# Patient Record
Sex: Male | Born: 1950 | Race: White | Hispanic: No | Smoking: Never smoker
Health system: Southern US, Community
[De-identification: ages and names within clinical notes are randomized; demographics above are authoritative.]

## PROBLEM LIST (undated history)

## (undated) DIAGNOSIS — E7800 Pure hypercholesterolemia, unspecified: Secondary | ICD-10-CM

## (undated) DIAGNOSIS — K900 Celiac disease: Secondary | ICD-10-CM

## (undated) DIAGNOSIS — Z9221 Personal history of antineoplastic chemotherapy: Secondary | ICD-10-CM

## (undated) DIAGNOSIS — N049 Nephrotic syndrome with unspecified morphologic changes: Secondary | ICD-10-CM

## (undated) DIAGNOSIS — N189 Chronic kidney disease, unspecified: Secondary | ICD-10-CM

## (undated) HISTORY — DX: Chronic kidney disease, unspecified: N18.9

## (undated) HISTORY — PX: EYE SURGERY: SHX253

## (undated) HISTORY — DX: Celiac disease: K90.0

---

## 1999-09-11 ENCOUNTER — Ambulatory Visit: Admission: RE | Admit: 1999-09-11 | Discharge: 1999-09-11 | Payer: Self-pay | Admitting: Family Medicine

## 1999-11-01 ENCOUNTER — Ambulatory Visit (HOSPITAL_COMMUNITY): Admission: RE | Admit: 1999-11-01 | Discharge: 1999-11-01 | Payer: Self-pay | Admitting: *Deleted

## 2000-01-01 ENCOUNTER — Encounter: Payer: Self-pay | Admitting: Nephrology

## 2000-01-01 ENCOUNTER — Ambulatory Visit (HOSPITAL_COMMUNITY): Admission: RE | Admit: 2000-01-01 | Discharge: 2000-01-02 | Payer: Self-pay | Admitting: Nephrology

## 2000-01-28 ENCOUNTER — Ambulatory Visit (HOSPITAL_COMMUNITY): Admission: RE | Admit: 2000-01-28 | Discharge: 2000-01-28 | Payer: Self-pay | Admitting: Emergency Medicine

## 2000-01-28 ENCOUNTER — Encounter: Payer: Self-pay | Admitting: Emergency Medicine

## 2000-02-01 ENCOUNTER — Encounter: Payer: Self-pay | Admitting: Emergency Medicine

## 2000-02-01 ENCOUNTER — Ambulatory Visit (HOSPITAL_COMMUNITY): Admission: RE | Admit: 2000-02-01 | Discharge: 2000-02-01 | Payer: Self-pay | Admitting: Emergency Medicine

## 2000-02-12 ENCOUNTER — Ambulatory Visit (HOSPITAL_COMMUNITY): Admission: RE | Admit: 2000-02-12 | Discharge: 2000-02-12 | Payer: Self-pay | Admitting: Gastroenterology

## 2000-10-29 ENCOUNTER — Encounter: Admission: RE | Admit: 2000-10-29 | Discharge: 2001-01-27 | Payer: Self-pay | Admitting: *Deleted

## 2000-11-21 ENCOUNTER — Ambulatory Visit (HOSPITAL_BASED_OUTPATIENT_CLINIC_OR_DEPARTMENT_OTHER): Admission: RE | Admit: 2000-11-21 | Discharge: 2000-11-21 | Payer: Self-pay | Admitting: Ophthalmology

## 2005-10-07 ENCOUNTER — Ambulatory Visit (HOSPITAL_COMMUNITY): Admission: RE | Admit: 2005-10-07 | Discharge: 2005-10-07 | Payer: Self-pay | Admitting: Nephrology

## 2005-10-23 ENCOUNTER — Ambulatory Visit (HOSPITAL_COMMUNITY): Admission: RE | Admit: 2005-10-23 | Discharge: 2005-10-23 | Payer: Self-pay | Admitting: Nephrology

## 2005-10-25 ENCOUNTER — Ambulatory Visit (HOSPITAL_COMMUNITY): Admission: RE | Admit: 2005-10-25 | Discharge: 2005-10-26 | Payer: Self-pay | Admitting: Nephrology

## 2007-01-13 ENCOUNTER — Encounter: Admission: RE | Admit: 2007-01-13 | Discharge: 2007-04-13 | Payer: Self-pay | Admitting: Gastroenterology

## 2009-09-18 ENCOUNTER — Encounter (INDEPENDENT_AMBULATORY_CARE_PROVIDER_SITE_OTHER): Payer: Self-pay | Admitting: *Deleted

## 2010-04-26 ENCOUNTER — Telehealth (INDEPENDENT_AMBULATORY_CARE_PROVIDER_SITE_OTHER): Payer: Self-pay | Admitting: *Deleted

## 2010-12-11 NOTE — Progress Notes (Signed)
Summary: CHANGED GI CARE  Phone Note Outgoing Call Call back at [facility] Phone [phone]   Call placed by: Harlow Mares CMA [facility]),  April 26, 2010 1:56 PM Call placed to: Patient Summary of Call: patient states that he has changed practices and already had his colonoscopy. we will note in IDX Initial call taken by: Harlow Mares CMA [facility]),  April 26, 2010 1:56 PM

## 2011-04-17 ENCOUNTER — Emergency Department (HOSPITAL_COMMUNITY): Payer: No Typology Code available for payment source

## 2011-04-17 ENCOUNTER — Emergency Department (HOSPITAL_COMMUNITY)
Admission: EM | Admit: 2011-04-17 | Discharge: 2011-04-17 | Disposition: A | Discharge status: Home / Self Care | Payer: No Typology Code available for payment source | Attending: Surgery | Admitting: Surgery

## 2011-04-17 DIAGNOSIS — S0003XA Contusion of scalp, initial encounter: Secondary | ICD-10-CM | POA: Insufficient documentation

## 2011-04-17 DIAGNOSIS — M542 Cervicalgia: Secondary | ICD-10-CM | POA: Insufficient documentation

## 2011-04-17 DIAGNOSIS — IMO0002 Reserved for concepts with insufficient information to code with codable children: Secondary | ICD-10-CM | POA: Insufficient documentation

## 2011-04-17 DIAGNOSIS — Y92410 Unspecified street and highway as the place of occurrence of the external cause: Secondary | ICD-10-CM | POA: Insufficient documentation

## 2011-04-17 DIAGNOSIS — Y998 Other external cause status: Secondary | ICD-10-CM | POA: Insufficient documentation

## 2011-04-17 DIAGNOSIS — K900 Celiac disease: Secondary | ICD-10-CM | POA: Insufficient documentation

## 2012-03-06 ENCOUNTER — Other Ambulatory Visit: Payer: Self-pay | Admitting: Dermatology

## 2012-04-09 ENCOUNTER — Encounter: Payer: Self-pay | Admitting: Gastroenterology

## 2013-04-04 IMAGING — CR DG CHEST 1V PORT
1 series · 1 of 1 positions shown · non-contrast
Comparison: None.

CLINICAL DATA: Fell - right chest wall pain

PORTABLE CHEST - 1 VIEW

[AP]
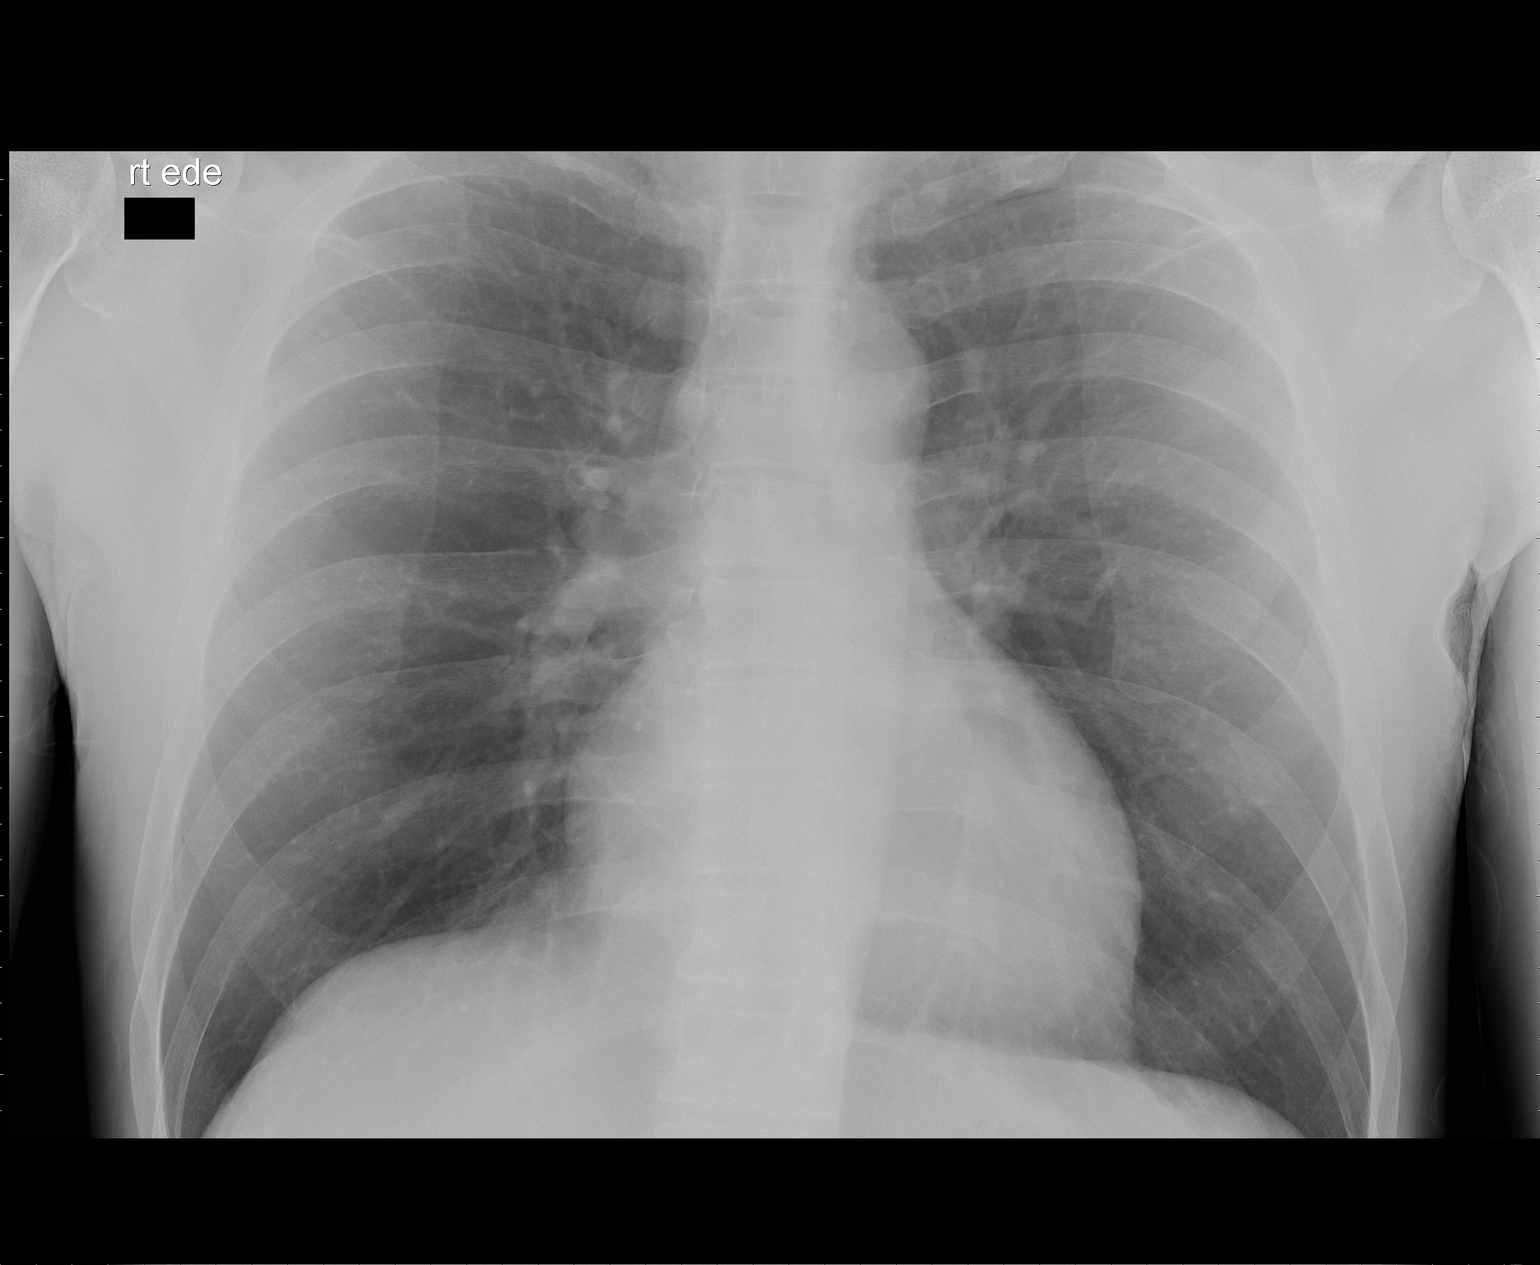

[1 of 1 positions shown; findings below may reference images not displayed]

FINDINGS: Heart and mediastinal contours normal.  Lungs clear.
Osseous structures intact in one-view.
IMPRESSION: No active disease in one-view.
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
## 2014-10-10 ENCOUNTER — Other Ambulatory Visit: Payer: Self-pay | Admitting: Dermatology

## 2014-10-10 ENCOUNTER — Other Ambulatory Visit: Payer: Self-pay

## 2014-10-10 DIAGNOSIS — N6459 Other signs and symptoms in breast: Secondary | ICD-10-CM

## 2014-10-20 ENCOUNTER — Ambulatory Visit
Admission: RE | Admit: 2014-10-20 | Discharge: 2014-10-20 | Disposition: A | Discharge status: Home / Self Care | Payer: 59 | Source: Ambulatory Visit

## 2014-10-20 DIAGNOSIS — N6459 Other signs and symptoms in breast: Secondary | ICD-10-CM

## 2015-01-29 ENCOUNTER — Ambulatory Visit (INDEPENDENT_AMBULATORY_CARE_PROVIDER_SITE_OTHER): Payer: 59 | Admitting: Emergency Medicine

## 2015-01-29 VITALS — BP 100/60 | HR 77 | Temp 97.4°F | Resp 16 | Ht 72.5 in | Wt 157.6 lb

## 2015-01-29 DIAGNOSIS — S338XXA Sprain of other parts of lumbar spine and pelvis, initial encounter: Secondary | ICD-10-CM

## 2015-01-29 DIAGNOSIS — S39012A Strain of muscle, fascia and tendon of lower back, initial encounter: Secondary | ICD-10-CM

## 2015-01-29 MED ORDER — CYCLOBENZAPRINE HCL 10 MG PO TABS: 10.0000 mg | ORAL_TABLET | Freq: Three times a day (TID) | ORAL | Status: DC | PRN

## 2015-01-29 MED ORDER — NAPROXEN SODIUM 550 MG PO TABS: 550.0000 mg | ORAL_TABLET | Freq: Two times a day (BID) | ORAL | Status: AC

## 2015-01-29 MED ORDER — HYDROCODONE-ACETAMINOPHEN 5-325 MG PO TABS: 1.0000 | ORAL_TABLET | ORAL | Status: DC | PRN

## 2015-01-29 NOTE — Patient Instructions (Signed)

## 2015-01-29 NOTE — Progress Notes (Signed)
[facility] [address], [place] [address] [phone]  Date:  01/29/2015   Name:  Bobby Galloatrick J Alsip   DOB:  07-21-1951   MRN:  [id number]  PCP:  No primary care provider on file.    Chief Complaint: Back Pain   History of Present Illness:  Bobby Osborne is a 64 y.o. very pleasant male patient who presents with the following:  Injured his back yesterday moving a mattress.  Wife is in hospice Non radiating.  No neuro symptoms.  Unable to stand erect No direct injury. Pain increases with standing and bending Less when sitting No improvement with over the counter medications or other home remedies. Denies other complaint or health concern today.   There are no active problems to display for this patient.   Past Medical History  Diagnosis Date  . Chronic kidney disease   . Celiac disease     Past Surgical History  Procedure Laterality Date  . Eye surgery      History  Substance Use Topics  . Smoking status: Never Smoker   . Smokeless tobacco: Not on file  . Alcohol Use: Not on file    Family History  Problem Relation Age of Onset  . Mental illness Mother     Allergies  Allergen Reactions  . Lipitor [Atorvastatin]     Affects liver enzymes     Medication list has been reviewed and updated.  No current outpatient prescriptions on file prior to visit.   No current facility-administered medications on file prior to visit.    Review of Systems:  As per HPI, otherwise negative.    Physical Examination: Filed Vitals:   01/29/15 0854  BP: 100/60  Pulse: 77  Temp: 97.4 F (36.3 C)  Resp: 16   Filed Vitals:   01/29/15 0854  Height: 6' 0.5" (1.842 m)  Weight: 157 lb 9.6 oz (71.487 kg)   Body mass index is 21.07 kg/(m^2). Ideal Body Weight: Weight in (lb) to have BMI = 25: 186.5  GEN: WDWN, tender lumbar paraspinous, Non-toxic, A & O x 3 HEENT: Atraumatic, Normocephalic. Neck supple. No masses, No LAD. Ears and  Nose: No external deformity. CV: RRR, No M/G/R. No JVD. No thrill. No extra heart sounds. PULM: CTA B, no wheezes, crackles, rhonchi. No retractions. No resp. distress. No accessory muscle use. ABD: S, NT, ND, +BS. No rebound. No HSM. EXTR: No c/c/e NEURO Normal gait.  PSYCH: Normally interactive. Conversant. Not depressed or anxious appearing.  Calm demeanor.  BACK:  Tender lumbar on left.  Motor intact   Assessment and Plan: Sciatic neuritis Anaprox  Flexeril vicodin  Signed,  Phillips OdorJeffery Balraj Brayfield, MD

## 2017-02-19 ENCOUNTER — Ambulatory Visit: Payer: Medicare Other | Attending: Family Medicine | Admitting: Physical Therapy

## 2017-02-19 ENCOUNTER — Encounter: Payer: Self-pay | Admitting: Physical Therapy

## 2017-02-19 DIAGNOSIS — M545 Low back pain, unspecified: Secondary | ICD-10-CM

## 2017-02-19 DIAGNOSIS — M62830 Muscle spasm of back: Secondary | ICD-10-CM
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
## 2017-02-25 ENCOUNTER — Ambulatory Visit: Payer: Medicare Other | Admitting: Physical Therapy

## 2017-02-25 ENCOUNTER — Encounter: Payer: Self-pay | Admitting: Physical Therapy

## 2017-02-25 DIAGNOSIS — M545 Low back pain, unspecified: Secondary | ICD-10-CM

## 2017-02-25 DIAGNOSIS — M62830 Muscle spasm of back: Secondary | ICD-10-CM

## 2017-02-25 NOTE — Therapy (Signed)
[facility] [address] [place], [place], [address] Phone: [phone]   Fax:  [phone]  Physical Therapy Treatment  Patient Details  Name: RILEN SHUKLA MRN: [id number] Date of Birth: [birth year] Referring Provider: Zoe Lan  Encounter Date: 02/25/2017      PT End of Session - 02/25/17 1529    Visit Number 2   Date for PT Re-Evaluation 04/21/17   PT Start Time 1445   PT Stop Time 1540   PT Time Calculation (min) 55 min   Activity Tolerance Patient tolerated treatment well   Behavior During Therapy [facility] for tasks assessed/performed      Past Medical History:  Diagnosis Date  . Celiac disease   . Chronic kidney disease     Past Surgical History:  Procedure Laterality Date  . EYE SURGERY      There were no vitals filed for this visit.      Subjective Assessment - 02/25/17 1449    Subjective Pt. reports no pain and says that he is doing well.   Currently in Pain? No/denies   Pain Score 0-No pain                         [facility] Adult PT Treatment/Exercise - 02/25/17 0001      Exercises   Exercises Lumbar     Lumbar Exercises: Stretches   Passive Hamstring Stretch 3 reps;20 seconds   Passive Hamstring Stretch Limitations both   Piriformis Stretch 2 reps;20 seconds  both     Lumbar Exercises: Aerobic   Stationary Bike Nustep 6 min      Lumbar Exercises: Machines for Strengthening   Cybex Knee Extension 2x10 10lbs    Cybex Knee Flexion 2x10 25lbs   Leg Press 3x10 30lbs   30lbs     Lumbar Exercises: Standing   Row Theraband;15 reps   Theraband Level (Row) Level 2 (Red)   Row Limitations 2 sets   Shoulder Extension Theraband;15 reps   Theraband Level (Shoulder Extension) Level 2 (Red)   Shoulder Extension Limitations 2 sets    Other Standing Lumbar Exercises overhead press with back extension with greenball  10 reps      Modalities   Modalities Electrical Stimulation      Electrical Stimulation   Electrical Stimulation Location low back    Electrical Stimulation Action IFC   Electrical Stimulation Parameters supine   Electrical Stimulation Goals Pain                            Plan - 02/25/17 1530    Clinical Impression Statement Pt. completed all exercises well. Reported no increase in pain. Tolerated HS stretch and piriformis stretch. He stated that exercises were comfortable. Could possibly tolerate more resistance during exercises. Pt. very talkative today.   Rehab Potential Good   PT Frequency 2x / week   PT Duration 8 weeks   PT Treatment/Interventions ADLs/Self Care Home Management;Cryotherapy;Electrical Stimulation;Moist Heat;Traction;Ultrasound;Patient/family education;Balance training;Therapeutic exercise;Therapeutic activities;Manual techniques   PT Next Visit Plan Will slowly get him moving in the gym, assess flexion vs extension   Consulted and Agree with Plan of Care Patient      Patient will benefit from skilled therapeutic intervention in order to improve the following deficits and impairments:  Abnormal gait, Decreased activity tolerance, Decreased mobility, Decreased strength, Decreased range of motion, Difficulty walking, Increased fascial restricitons, Increased muscle  spasms, Impaired flexibility, Improper body mechanics, Pain  Visit Diagnosis: Muscle spasm of back  Acute midline low back pain without sciatica     Problem List There are no active problems to display for this patient.   Dorothyann Gibbs 02/25/2017, 3:39 PM  [facility] [address] [place], [place], [address] Phone: [phone]   Fax:  [phone]  Name: NATHANUEL CABREJA MRN: [id number] Date of Birth: [birth year]

## 2017-02-27 ENCOUNTER — Encounter: Payer: Self-pay | Admitting: Physical Therapy

## 2017-02-27 ENCOUNTER — Ambulatory Visit: Payer: Medicare Other | Admitting: Physical Therapy

## 2017-02-27 DIAGNOSIS — M545 Low back pain, unspecified: Secondary | ICD-10-CM

## 2017-02-27 DIAGNOSIS — M62830 Muscle spasm of back: Secondary | ICD-10-CM | POA: Diagnosis not present

## 2017-02-27 NOTE — Therapy (Signed)
[facility] [address] [place], [place], [address] Phone: [phone]   Fax:  [phone]  Physical Therapy Treatment  Patient Details  Name: Bobby Osborne MRN: [id number] Date of Birth: 1951/10/10 Referring Provider: Zoe Lan  Encounter Date: 02/27/2017      PT End of Session - 02/27/17 1525    Visit Number 3   Date for PT Re-Evaluation 04/21/17   PT Start Time 1444   PT Stop Time 1542   PT Time Calculation (min) 58 min   Activity Tolerance Patient tolerated treatment well   Behavior During Therapy [facility] for tasks assessed/performed      Past Medical History:  Diagnosis Date  . Celiac disease   . Chronic kidney disease     Past Surgical History:  Procedure Laterality Date  . EYE SURGERY      There were no vitals filed for this visit.      Subjective Assessment - 02/27/17 1502    Subjective I was a little sore, but no increase of pain right now   Currently in Pain? No/denies                         [facility] Adult PT Treatment/Exercise - 02/27/17 0001      Lumbar Exercises: Aerobic   Elliptical 5 minutes R=6 I-10   UBE (Upper Arm Bike) resisted gait all directions     Lumbar Exercises: Machines for Strengthening   Cybex Knee Extension 2x10 15lbs    Cybex Knee Flexion 2x10 25lbs   Other Lumbar Machine Exercise 10# scapular stabilization 2 ways on pulleys, 20# AR press   Other Lumbar Machine Exercise 5# hip extension and abduction, 25# seated rows and lats , weighted ball lifts back to wall     Electrical Stimulation   Electrical Stimulation Location low back    Electrical Stimulation Action IFC   Electrical Stimulation Parameters supine   Electrical Stimulation Goals Pain                  PT Short Term Goals - 02/27/17 1528      PT SHORT TERM GOAL #1   Title independent with initial HEP   Time 1   Period Weeks   Status Achieved           PT Long Term  Goals - 02/27/17 1528      PT LONG TERM GOAL #1   Title decrease pain 50%   Time 8   Period Weeks   Status New     PT LONG TERM GOAL #2   Title increase Lumbar ROM to WNL's   Time 8   Period Weeks   Status New     PT LONG TERM GOAL #3   Title understand proper posture and body mechanics   Time 8   Period Weeks   Status New     PT LONG TERM GOAL #4   Title return safely to gym setting   Time 8   Period Weeks   Status New               Plan - 02/27/17 1526    Clinical Impression Statement Patient with some tightness and pain with calf stretches and hip exercises, all other exercises caused no issues.     PT Next Visit Plan core and stretches   Consulted and Agree with Plan of Care Patient      Patient  will benefit from skilled therapeutic intervention in order to improve the following deficits and impairments:  Abnormal gait, Decreased activity tolerance, Decreased mobility, Decreased strength, Decreased range of motion, Difficulty walking, Increased fascial restricitons, Increased muscle spasms, Impaired flexibility, Improper body mechanics, Pain  Visit Diagnosis: Muscle spasm of back  Acute midline low back pain without sciatica     Problem List There are no active problems to display for this patient.   Jearld Lesch., PT 02/27/2017, 3:29 PM  [facility] [place] [facility] [address] [place], [place], [address] Phone: [phone]   Fax:  [phone]  Name: SOURISH ALLENDER MRN: [id number] Date of Birth: 09-Nov-1951

## 2017-03-03 ENCOUNTER — Ambulatory Visit: Payer: Medicare Other | Admitting: Physical Therapy

## 2017-03-07 ENCOUNTER — Ambulatory Visit: Payer: Medicare Other | Admitting: Physical Therapy

## 2017-03-11 ENCOUNTER — Ambulatory Visit: Payer: Medicare Other | Attending: Family Medicine | Admitting: Physical Therapy

## 2017-03-11 ENCOUNTER — Encounter: Payer: Self-pay | Admitting: Physical Therapy

## 2017-03-11 DIAGNOSIS — M545 Low back pain, unspecified: Secondary | ICD-10-CM

## 2017-03-11 DIAGNOSIS — M62830 Muscle spasm of back: Secondary | ICD-10-CM

## 2017-03-11 NOTE — Therapy (Signed)
[facility] [address] [place], [place], [address] Phone: [phone]   Fax:  [phone]  Physical Therapy Treatment  Patient Details  Name: Bobby Osborne MRN: [id number] Date of Birth: 10/04/1951 Referring Provider: Zoe Lan  Encounter Date: 03/11/2017      PT End of Session - 03/11/17 0916    Visit Number 4   Date for PT Re-Evaluation 04/21/17   PT Start Time 0839   PT Stop Time 0930   PT Time Calculation (min) 51 min   Activity Tolerance Patient tolerated treatment well   Behavior During Therapy [facility] for tasks assessed/performed      Past Medical History:  Diagnosis Date  . Celiac disease   . Chronic kidney disease     Past Surgical History:  Procedure Laterality Date  . EYE SURGERY      There were no vitals filed for this visit.      Subjective Assessment - 03/11/17 0841    Subjective Patient reports no pain in his back and is feeling good.    Currently in Pain? No/denies                         [facility] Adult PT Treatment/Exercise - 03/11/17 0001      Lumbar Exercises: Aerobic   Elliptical 6 minutes R=6 I-10   UBE (Upper Arm Bike) Constant work 40 watts x 4 min     Lumbar Exercises: Machines for Strengthening   Cybex Knee Extension 2x10 15lbs    Cybex Knee Flexion 2x10 25lbs   Other Lumbar Machine Exercise standing chops left,straight,right 2 x 12, seated black tband ext 2 x 10   Other Lumbar Machine Exercise lat pull & rows 25# 2 x 10      Modalities   Modalities Electrical Stimulation;Moist Heat     Moist Heat Therapy   Number Minutes Moist Heat 15 Minutes   Moist Heat Location Lumbar Spine     Electrical Stimulation   Electrical Stimulation Location low back    Electrical Stimulation Action IFC   Electrical Stimulation Parameters supine   Electrical Stimulation Goals Pain                  PT Short Term Goals - 02/27/17 1528      PT SHORT TERM  GOAL #1   Title independent with initial HEP   Time 1   Period Weeks   Status Achieved           PT Long Term Goals - 02/27/17 1528      PT LONG TERM GOAL #1   Title decrease pain 50%   Time 8   Period Weeks   Status New     PT LONG TERM GOAL #2   Title increase Lumbar ROM to WNL's   Time 8   Period Weeks   Status New     PT LONG TERM GOAL #3   Title understand proper posture and body mechanics   Time 8   Period Weeks   Status New     PT LONG TERM GOAL #4   Title return safely to gym setting   Time 8   Period Weeks   Status New               Plan - 03/11/17 0912    Clinical Impression Statement The patient stated that the constant work of the UBE was difficult. He required  VCs to get full ROM on leg extension. The patient also required cues to control and slow down the exercises.    PT Next Visit Plan Cont with strenghtening and flexibility      Patient will benefit from skilled therapeutic intervention in order to improve the following deficits and impairments:  Abnormal gait, Decreased activity tolerance, Decreased mobility, Decreased strength, Decreased range of motion, Difficulty walking, Increased fascial restricitons, Increased muscle spasms, Impaired flexibility, Improper body mechanics, Pain  Visit Diagnosis: Muscle spasm of back  Acute midline low back pain without sciatica     Problem List There are no active problems to display for this patient.   Raquel James SPTA 03/11/2017, 9:31 AM  [facility] [place] [facility] [address] [place], [place], [address] Phone: [phone]   Fax:  [phone]  Name: KRUZE ATCHLEY MRN: [id number] Date of Birth: 12-23-1950

## 2017-03-13 ENCOUNTER — Encounter: Payer: Self-pay | Admitting: Physical Therapy

## 2017-03-13 ENCOUNTER — Ambulatory Visit: Payer: Medicare Other | Admitting: Physical Therapy

## 2017-03-13 DIAGNOSIS — M545 Low back pain, unspecified: Secondary | ICD-10-CM

## 2017-03-13 DIAGNOSIS — M62830 Muscle spasm of back: Secondary | ICD-10-CM | POA: Diagnosis not present

## 2017-03-13 NOTE — Therapy (Signed)
[place], [place], [address] Phone: [phone]   Fax:  [phone]  Physical Therapy Treatment  Patient Details  Name: Bobby Osborne MRN: [id number] Date of Birth: 08/09/1951 Referring Provider: Eldridge Abrahams  Encounter Date: 03/13/2017      PT End of Session - 03/13/17 0846    Visit Number 5   Date for PT Re-Evaluation 04/21/17   PT Start Time 0802   PT Stop Time 1219   PT Time Calculation (min) 55 min   Activity Tolerance Patient tolerated treatment well   Behavior During Therapy [facility] for tasks assessed/performed      Past Medical History:  Diagnosis Date  . Celiac disease   . Chronic kidney disease     Past Surgical History:  Procedure Laterality Date  . EYE SURGERY      There were no vitals filed for this visit.      Subjective Assessment - 03/13/17 0802    Subjective Ptr reports no pain. His back was sore yesterday but he thinks its from the chair he sits in at home. No other issuses since last visit.    Currently in Pain? No/denies   Pain Score 0-No pain                         [facility] Adult PT Treatment/Exercise - 03/13/17 0001      Lumbar Exercises: Stretches   Passive Hamstring Stretch 3 reps;20 seconds   Lower Trunk Rotation 2 reps;3 reps;20 seconds     Lumbar Exercises: Aerobic   Stationary Bike lvl2 6 min      Lumbar Exercises: Machines for Strengthening   Leg Press 3x10 30lbs    Other Lumbar Machine Exercise lat pull & rows 25# 2 x 10      Lumbar Exercises: Standing   Other Standing Lumbar Exercises standing trunk rotation/back extention  2x10   yellow ball     Lumbar Exercises: Supine   Other Supine Lumbar Exercises single leg bridges  2x10      Modalities   Modalities Electrical Stimulation;Moist Heat     Moist Heat Therapy   Number Minutes Moist Heat 15 Minutes   Moist Heat Location Lumbar Spine     Electrical Stimulation   Electrical Stimulation Location low back    Electrical Stimulation Action IFC   Electrical Stimulation Parameters supine   Electrical Stimulation Goals Pain                  PT Short Term Goals - 02/27/17 1528      PT SHORT TERM GOAL #1   Title independent with initial HEP   Time 1   Period Weeks   Status Achieved           PT Long Term Goals - 03/13/17 0849      PT LONG TERM GOAL #1   Title decrease pain 50%   Status Achieved     PT LONG TERM GOAL #2   Title increase Lumbar ROM to WNL's   Time 8   Period Weeks   Status On-going     PT LONG TERM GOAL #3   Title understand proper posture and body mechanics   Period Weeks   Status Partially Met     PT LONG TERM GOAL #4   Title return safely to gym setting   Time 8   Period Weeks   Status New  Plan - 03/13/17 0846    Clinical Impression Statement Pt tolerated treatment well. Pt had some weakness and difficulty keeping RLE straight during single leg bridges. No reports of increased pain only reported muscle fatigue.    Rehab Potential Good   PT Frequency 2x / week   PT Duration 8 weeks   PT Treatment/Interventions ADLs/Self Care Home Management;Cryotherapy;Electrical Stimulation;Moist Heat;Traction;Ultrasound;Patient/family education;Balance training;Therapeutic exercise;Therapeutic activities;Manual techniques   PT Next Visit Plan Cont with strenghtening and flexibility      Patient will benefit from skilled therapeutic intervention in order to improve the following deficits and impairments:  Abnormal gait, Decreased activity tolerance, Decreased mobility, Decreased strength, Decreased range of motion, Difficulty walking, Increased fascial restricitons, Increased muscle spasms, Impaired flexibility, Improper body mechanics, Pain  Visit Diagnosis: Muscle spasm of back  Acute midline low back pain without sciatica     Problem List There are no active problems to display for  this patient.   Octavia Bruckner 03/13/2017, 8:50 AM  [place], [place], [address] Phone: [phone]   Fax:  [phone]  Name: Bobby Osborne MRN: [id number] Date of Birth: 11/15/1950

## 2017-03-17 ENCOUNTER — Encounter: Payer: Self-pay | Admitting: Physical Therapy

## 2017-03-17 ENCOUNTER — Ambulatory Visit: Payer: Medicare Other | Admitting: Physical Therapy

## 2017-03-17 DIAGNOSIS — M62830 Muscle spasm of back: Secondary | ICD-10-CM

## 2017-03-17 DIAGNOSIS — M545 Low back pain, unspecified: Secondary | ICD-10-CM

## 2017-03-17 NOTE — Therapy (Signed)
[place], [place], [address] Phone: [phone]   Fax:  [phone]  Physical Therapy Treatment  Patient Details  Name: Bobby Osborne MRN: [id number] Date of Birth: 07-Jan-1951 Referring Provider: Eldridge Abrahams  Encounter Date: 03/17/2017      PT End of Session - 03/17/17 0928    Visit Number 6   Date for PT Re-Evaluation 04/21/17   PT Start Time 0845   PT Stop Time 0942   PT Time Calculation (min) 57 min   Activity Tolerance Patient tolerated treatment well   Behavior During Therapy [facility] for tasks assessed/performed      Past Medical History:  Diagnosis Date  . Celiac disease   . Chronic kidney disease     Past Surgical History:  Procedure Laterality Date  . EYE SURGERY      There were no vitals filed for this visit.      Subjective Assessment - 03/17/17 0846    Subjective "My back hurts, I woke up this morning my back was killing me, I took a hot shower this morning"   Currently in Pain? Yes   Pain Score 3    Pain Location Back   Pain Orientation Lower;Left                         [facility] Adult PT Treatment/Exercise - 03/17/17 0001      Lumbar Exercises: Aerobic   Elliptical 4 minutes R=5 I-10   UBE (Upper Arm Bike) L 6 33fd/3rev     Lumbar Exercises: Machines for Strengthening   Leg Press 3x10 40lbs    Other Lumbar Machine Exercise lat pull & rows 25# 2x15     Lumbar Exercises: Standing   Other Standing Lumbar Exercises Straight arm pull downs 25lb 2x15   Other Standing Lumbar Exercises standing trunk rotation/back extention  2x10      Lumbar Exercises: Seated   Long Arc Quad on Chair --  yellow ball     Modalities   Modalities Electrical Stimulation;Moist Heat     Moist Heat Therapy   Number Minutes Moist Heat 15 Minutes   Moist Heat Location Lumbar Spine     Electrical Stimulation   Electrical Stimulation Location low back    Electrical  Stimulation Action IFC   Electrical Stimulation Parameters supine   Electrical Stimulation Goals Pain                  PT Short Term Goals - 02/27/17 1528      PT SHORT TERM GOAL #1   Title independent with initial HEP   Time 1   Period Weeks   Status Achieved           PT Long Term Goals - 03/13/17 0849      PT LONG TERM GOAL #1   Title decrease pain 50%   Status Achieved     PT LONG TERM GOAL #2   Title increase Lumbar ROM to WNL's   Time 8   Period Weeks   Status On-going     PT LONG TERM GOAL #3   Title understand proper posture and body mechanics   Period Weeks   Status Partially Met     PT LONG TERM GOAL #4   Title return safely to gym setting   Time 8   Period Weeks   Status New  Plan - 03/17/17 0928    Clinical Impression Statement Pt able to do all of today's exercises, does have some grimace of pain with trunk rotations. Reports that he could feel his back with straight arm pull downs and on elliptical. Reports that he left his windows up in his house making it too cold causing increase back pain.   Rehab Potential Good   PT Frequency 2x / week   PT Duration 8 weeks   PT Treatment/Interventions ADLs/Self Care Home Management;Cryotherapy;Electrical Stimulation;Moist Heat;Traction;Ultrasound;Patient/family education;Balance training;Therapeutic exercise;Therapeutic activities;Manual techniques   PT Next Visit Plan Cont with strenghtening and flexibility      Patient will benefit from skilled therapeutic intervention in order to improve the following deficits and impairments:  Abnormal gait, Decreased activity tolerance, Decreased mobility, Decreased strength, Decreased range of motion, Difficulty walking, Increased fascial restricitons, Increased muscle spasms, Impaired flexibility, Improper body mechanics, Pain  Visit Diagnosis: Muscle spasm of back  Acute midline low back pain without sciatica     Problem  List There are no active problems to display for this patient.   Scot Jun 03/17/2017, 9:31 AM  [facility] [address] [place], [place], [address] Phone: [phone]   Fax:  [phone]  Name: Bobby Osborne MRN: [id number] Date of Birth: 1951/01/25

## 2017-03-20 ENCOUNTER — Encounter: Payer: Self-pay | Admitting: Physical Therapy

## 2017-03-20 ENCOUNTER — Ambulatory Visit: Payer: Medicare Other | Admitting: Physical Therapy

## 2017-03-20 DIAGNOSIS — M62830 Muscle spasm of back: Secondary | ICD-10-CM

## 2017-03-20 DIAGNOSIS — M545 Low back pain, unspecified: Secondary | ICD-10-CM

## 2017-03-20 NOTE — Therapy (Signed)
[place], [place], [address] Phone: [phone]   Fax:  [phone]  Physical Therapy Treatment  Patient Details  Name: Bobby Osborne MRN: [id number] Date of Birth: 1951/07/15 Referring Provider: Eldridge Abrahams  Encounter Date: 03/20/2017      PT End of Session - 03/20/17 0928    Visit Number 7   Date for PT Re-Evaluation 04/21/17   PT Start Time 0845   PT Stop Time 0944   PT Time Calculation (min) 59 min   Activity Tolerance Patient tolerated treatment well   Behavior During Therapy [facility] for tasks assessed/performed      Past Medical History:  Diagnosis Date  . Celiac disease   . Chronic kidney disease     Past Surgical History:  Procedure Laterality Date  . EYE SURGERY      There were no vitals filed for this visit.      Subjective Assessment - 03/20/17 0848    Subjective "Feeling good, windows are closed"   Currently in Pain? No/denies   Pain Score 0-No pain                         [facility] Adult PT Treatment/Exercise - 03/20/17 0001      Lumbar Exercises: Aerobic   Elliptical 4 minutes R=5 I-10   UBE (Upper Arm Bike) L 6 30fd/3rev     Lumbar Exercises: Machines for Strengthening   Cybex Knee Extension 3x10 15lbs    Cybex Knee Flexion 3x10 35lbs   Leg Press 3x10 40lbs    Other Lumbar Machine Exercise lat pull & rows 35# 3x10     Lumbar Exercises: Standing   Other Standing Lumbar Exercises Straight arm pull downs 25lb 2x15   Other Standing Lumbar Exercises Pallof press 25lb x10     Modalities   Modalities Electrical Stimulation;Moist Heat     Moist Heat Therapy   Number Minutes Moist Heat 15 Minutes   Moist Heat Location Lumbar Spine     Electrical Stimulation   Electrical Stimulation Location low back    Electrical Stimulation Action IFC   Electrical Stimulation Parameters supine   Electrical Stimulation Goals Pain                   PT Short Term Goals - 02/27/17 1528      PT SHORT TERM GOAL #1   Title independent with initial HEP   Time 1   Period Weeks   Status Achieved           PT Long Term Goals - 03/20/17 0930      PT LONG TERM GOAL #2   Title increase Lumbar ROM to WNL's   Status On-going     PT LONG TERM GOAL #3   Title understand proper posture and body mechanics   Status Partially Met     PT LONG TERM GOAL #4   Title return safely to gym setting   Status On-going               Plan - 03/20/17 03976   Clinical Impression Statement Pt performed all of his exercises well. Pt does reports some LBP with pall of press. Pt able to handel intervention with additional weight and reps.    Rehab Potential Good   PT Treatment/Interventions ADLs/Self Care Home Management;Cryotherapy;Electrical Stimulation;Moist Heat;Traction;Ultrasound;Patient/family education;Balance training;Therapeutic exercise;Therapeutic activities;Manual techniques   PT Next Visit Plan Cont with  strengthening and flexibility      Patient will benefit from skilled therapeutic intervention in order to improve the following deficits and impairments:  Abnormal gait, Decreased activity tolerance, Decreased mobility, Decreased strength, Decreased range of motion, Difficulty walking, Increased fascial restricitons, Increased muscle spasms, Impaired flexibility, Improper body mechanics, Pain  Visit Diagnosis: Muscle spasm of back  Acute midline low back pain without sciatica     Problem List There are no active problems to display for this patient.   Scot Jun, PTA 03/20/2017, 9:30 AM  [place], [place], [address] Phone: [phone]   Fax:  [phone]  Name: Bobby Osborne MRN: [id number] Date of Birth: [birth year]

## 2017-03-25 ENCOUNTER — Ambulatory Visit: Payer: Medicare Other | Admitting: Physical Therapy

## 2017-03-25 ENCOUNTER — Encounter: Payer: Self-pay | Admitting: Physical Therapy

## 2017-03-25 DIAGNOSIS — M545 Low back pain, unspecified: Secondary | ICD-10-CM

## 2017-03-25 DIAGNOSIS — M62830 Muscle spasm of back: Secondary | ICD-10-CM | POA: Diagnosis not present

## 2017-03-25 NOTE — Therapy (Signed)
[place], [place], [address] Phone: [phone]   Fax:  [phone]  Physical Therapy Treatment  Patient Details  Name: Bobby Osborne MRN: [id number] Date of Birth: 1950-12-21 Referring Provider: Eldridge Abrahams  Encounter Date: 03/25/2017      PT End of Session - 03/25/17 1010    Visit Number 8   Date for PT Re-Evaluation 04/21/17   PT Start Time 0929   PT Stop Time 1023   PT Time Calculation (min) 54 min   Activity Tolerance Patient tolerated treatment well   Behavior During Therapy [facility] for tasks assessed/performed      Past Medical History:  Diagnosis Date  . Celiac disease   . Chronic kidney disease     Past Surgical History:  Procedure Laterality Date  . EYE SURGERY      There were no vitals filed for this visit.      Subjective Assessment - 03/25/17 0929    Subjective "Good, good, doing great "   Currently in Pain? No/denies   Pain Score 0-No pain            [facility] PT Assessment - 03/25/17 0001      AROM   Overall AROM Comments AROM of the lumbar spine  decreased 25% with extension, some pain with R side bending                     [facility] Adult PT Treatment/Exercise - 03/25/17 0001      Lumbar Exercises: Stretches   Passive Hamstring Stretch 4 reps;10 seconds   Single Knee to Chest Stretch 1 rep;10 seconds   Lower Trunk Rotation 1 rep;20 seconds     Lumbar Exercises: Aerobic   Elliptical 6 minutes R=10 I-10   UBE (Upper Arm Bike) L 6 82fd/2rev Standing      Lumbar Exercises: Machines for Strengthening   Cybex Knee Extension 3x10 15lbs    Cybex Knee Flexion 3x10 35lbs   Other Lumbar Machine Exercise lat pull & rows 35# 3x10                  PT Short Term Goals - 02/27/17 1528      PT SHORT TERM GOAL #1   Title independent with initial HEP   Time 1   Period Weeks   Status Achieved           PT Long Term Goals - 03/25/17  03494     PT LONG TERM GOAL #1   Title decrease pain 50%   Status Achieved     PT LONG TERM GOAL #2   Title increase Lumbar ROM to WNL's   Status Partially Met     PT LONG TERM GOAL #3   Title understand proper posture and body mechanics     PT LONG TERM GOAL #4   Title return safely to gym setting   Status Partially Met               Plan - 03/25/17 1010    Clinical Impression Statement Pt has progresses towards all goals, completing some. Does reports some pain in his low back L side with trunk rotations and R side bending. Good strength with machine exercises   Rehab Potential Good   PT Frequency 2x / week   PT Duration 8 weeks   PT Treatment/Interventions ADLs/Self Care Home Management;Cryotherapy;Electrical Stimulation;Moist Heat;Traction;Ultrasound;Patient/family education;Balance training;Therapeutic exercise;Therapeutic activities;Manual techniques  PT Next Visit Plan D/C PT next visit      Patient will benefit from skilled therapeutic intervention in order to improve the following deficits and impairments:  Abnormal gait, Decreased activity tolerance, Decreased mobility, Decreased strength, Decreased range of motion, Difficulty walking, Increased fascial restricitons, Increased muscle spasms, Impaired flexibility, Improper body mechanics, Pain  Visit Diagnosis: Muscle spasm of back  Acute midline low back pain without sciatica     Problem List There are no active problems to display for this patient.   Scot Jun, PTA 03/25/2017, 10:12 AM  [place] [address] [place], [place], [address] Phone: [phone]   Fax:  [phone]  Name: NIKLAS CHRETIEN MRN: [id number] Date of Birth: May 22, 1951

## 2017-03-31 ENCOUNTER — Encounter: Payer: Self-pay | Admitting: Physical Therapy

## 2017-03-31 ENCOUNTER — Ambulatory Visit: Payer: Medicare Other | Admitting: Physical Therapy

## 2017-03-31 DIAGNOSIS — M62830 Muscle spasm of back: Secondary | ICD-10-CM | POA: Diagnosis not present

## 2017-03-31 DIAGNOSIS — M545 Low back pain, unspecified: Secondary | ICD-10-CM

## 2017-03-31 NOTE — Therapy (Signed)
[place], [place], [address] Phone: [phone]   Fax:  [phone]  Physical Therapy Treatment  Patient Details  Name: Bobby Osborne MRN: [id number] Date of Birth: August 11, 1951 Referring Provider: Eldridge Abrahams  Encounter Date: 03/31/2017      PT End of Session - 03/31/17 1057    Visit Number 9   Date for PT Re-Evaluation 04/21/17   PT Start Time 1014   PT Stop Time 1113   PT Time Calculation (min) 59 min   Activity Tolerance Patient tolerated treatment well   Behavior During Therapy [facility] for tasks assessed/performed      Past Medical History:  Diagnosis Date  . Celiac disease   . Chronic kidney disease     Past Surgical History:  Procedure Laterality Date  . EYE SURGERY      There were no vitals filed for this visit.      Subjective Assessment - 03/31/17 1013    Subjective "Doing good"   Currently in Pain? No/denies   Pain Score 0-No pain                         [facility] Adult PT Treatment/Exercise - 03/31/17 0001      Lumbar Exercises: Aerobic   Elliptical 6 minutes R=10 I-10   UBE (Upper Arm Bike) constant work 30 wats 2 frd/ 2 rev     Lumbar Exercises: Machines for Strengthening   Cybex Knee Extension 3x10 15lbs    Cybex Knee Flexion 3x10 35lbs   Leg Press 3x10 40lbs    Other Lumbar Machine Exercise lat pull & rows 35# 3x10     Lumbar Exercises: Standing   Other Standing Lumbar Exercises Straight arm pull downs 25lb 2x15   Other Standing Lumbar Exercises Pallof press 25lb x10     Modalities   Modalities Electrical Stimulation;Moist Heat     Moist Heat Therapy   Number Minutes Moist Heat 15 Minutes   Moist Heat Location Lumbar Spine     Electrical Stimulation   Electrical Stimulation Location low back    Electrical Stimulation Action IFC   Electrical Stimulation Parameters supine   Electrical Stimulation Goals Pain                   PT Short Term Goals - 02/27/17 1528      PT SHORT TERM GOAL #1   Title independent with initial HEP   Time 1   Period Weeks   Status Achieved           PT Long Term Goals - 03/25/17 2703      PT LONG TERM GOAL #1   Title decrease pain 50%   Status Achieved     PT LONG TERM GOAL #2   Title increase Lumbar ROM to WNL's   Status Partially Met     PT LONG TERM GOAL #3   Title understand proper posture and body mechanics     PT LONG TERM GOAL #4   Title return safely to gym setting   Status Partially Met               Plan - 03/31/17 1057    Clinical Impression Statement No issues with today's activities, Pt is pleased with his current functional status.   Rehab Potential Good   PT Frequency 2x / week   PT Duration 8 weeks   PT Treatment/Interventions ADLs/Self Care  Home Management;Cryotherapy;Electrical Stimulation;Moist Heat;Traction;Ultrasound;Patient/family education;Balance training;Therapeutic exercise;Therapeutic activities;Manual techniques   PT Next Visit Plan D/C      Patient will benefit from skilled therapeutic intervention in order to improve the following deficits and impairments:  Abnormal gait, Decreased activity tolerance, Decreased mobility, Decreased strength, Decreased range of motion, Difficulty walking, Increased fascial restricitons, Increased muscle spasms, Impaired flexibility, Improper body mechanics, Pain  Visit Diagnosis: Muscle spasm of back  Acute midline low back pain without sciatica     Problem List There are no active problems to display for this patient.   PHYSICAL THERAPY DISCHARGE SUMMARY  Visits from Start of Care: 9 Plan: Patient agrees to discharge.  Patient goals were partially met. Patient is being discharged due to being pleased with the current functional level.  ?????      Scot Jun , PTA 03/31/2017, 10:58 AM  [place], [place], [address] Phone: [phone]   Fax:  [phone]  Name: Bobby Osborne MRN: [id number] Date of Birth: 06-14-1951

## 2017-11-11 HISTORY — PX: COLONOSCOPY: SHX174

## 2018-10-08 ENCOUNTER — Encounter (HOSPITAL_COMMUNITY): Payer: Self-pay

## 2018-10-08 ENCOUNTER — Emergency Department (HOSPITAL_COMMUNITY)
Admission: EM | Admit: 2018-10-08 | Discharge: 2018-10-08 | Disposition: A | Discharge status: Home / Self Care | Payer: Medicare Other | Attending: Emergency Medicine | Admitting: Emergency Medicine

## 2018-10-08 ENCOUNTER — Other Ambulatory Visit: Payer: Self-pay

## 2018-10-08 DIAGNOSIS — R290 Tetany: Secondary | ICD-10-CM | POA: Diagnosis not present

## 2018-10-08 DIAGNOSIS — R200 Anesthesia of skin: Secondary | ICD-10-CM | POA: Diagnosis present

## 2018-10-08 LAB — CBC WITH DIFFERENTIAL/PLATELET
Abs Immature Granulocytes: 0.04 10*3/uL (ref 0.00–0.07)
Basophils Absolute: 0 10*3/uL (ref 0.0–0.1)
Basophils Relative: 0 %
Eosinophils Absolute: 0.2 10*3/uL (ref 0.0–0.5)
Eosinophils Relative: 2 %
HCT: 46.2 % (ref 39.0–52.0)
Hemoglobin: 15.4 g/dL (ref 13.0–17.0)
Immature Granulocytes: 0 %
Lymphocytes Relative: 13 %
Lymphs Abs: 1.3 10*3/uL (ref 0.7–4.0)
MCH: 30.4 pg (ref 26.0–34.0)
MCHC: 33.3 g/dL (ref 30.0–36.0)
MCV: 91.1 fL (ref 80.0–100.0)
Monocytes Absolute: 0.9 10*3/uL (ref 0.1–1.0)
Monocytes Relative: 8 %
Neutro Abs: 7.9 10*3/uL — ABNORMAL HIGH (ref 1.7–7.7)
Neutrophils Relative %: 77 %
Platelets: 250 10*3/uL (ref 150–400)
RBC: 5.07 MIL/uL (ref 4.22–5.81)
RDW: 12.5 % (ref 11.5–15.5)
WBC: 10.4 10*3/uL (ref 4.0–10.5)
nRBC: 0 % (ref 0.0–0.2)

## 2018-10-08 LAB — BASIC METABOLIC PANEL
Anion gap: 9 (ref 5–15)
BUN: 18 mg/dL (ref 8–23)
CO2: 24 mmol/L (ref 22–32)
Calcium: 9.1 mg/dL (ref 8.9–10.3)
Chloride: 105 mmol/L (ref 98–111)
Creatinine, Ser: 1.06 mg/dL (ref 0.61–1.24)
GFR calc Af Amer: >60 mL/min (ref >60)
GFR calc non Af Amer: >60 mL/min (ref >60)
Glucose, Bld: 157 mg/dL — ABNORMAL HIGH (ref 70–99)
Potassium: 3.9 mmol/L (ref 3.5–5.1)
Sodium: 138 mmol/L (ref 135–145)

## 2018-10-08 LAB — MAGNESIUM: Magnesium: 1.7 mg/dL (ref 1.7–2.4)

## 2018-10-08 NOTE — Discharge Instructions (Signed)
Please read and follow all provided instructions.  Your diagnoses today include:  1. Car[place]ed[place] spasm     Tests performed today include:  Blood counts and electrolytes -slightly high blood sugar but otherwise okay  Vital signs. See below for your results today.   Medications prescribed:   None  Take any prescribed medications only as directed.  Home care instructions:  Follow any educational materials contained in this packet.  BE VERY CAREFUL not to take multiple medicines containing Tylenol (also called acetaminophen). Doing so can lead to an overdose which can damage your liver and cause liver failure and possibly death.   Follow-up instructions: Please follow-up with your primary care provider in the next 3 days for further evaluation of your symptoms.   Return instructions:   Please return to the Emergency Department if you experience worsening symptoms.   Return if you have weakness in your arms or legs, slurred speech, trouble walking or talking, confusion, or trouble with your balance.   Please return if you have any other emergent concerns.  Additional Information:  Your vital signs today were: BP 110/79    Pulse 91    Temp 98.7 F (37.1 C) (Oral)    Resp 13    SpO2 94%  If your blood pressure (BP) was elevated above 135/85 this visit, please have this repeated by your doctor within one month. --------------

## 2018-10-08 NOTE — ED Notes (Signed)
Stroke assessment done in triage with no drift, no facial droop, bilateral hand numbness noted, no weakness.  No Code Stroke called at this time.

## 2018-10-08 NOTE — ED Notes (Signed)
ED Provider at bedside. 

## 2018-10-08 NOTE — ED Provider Notes (Signed)
[facility] EMERGENCY DEPARTMENT Provider Note   CSN: [id number] Arrival date & time: 10/08/18  1904     History   Chief Complaint Chief Complaint  Patient presents with  . Numbness    HPI Bobby Osborne is a 67 y.o. male.  Patient with history of high cholesterol, celiac disease presents the emergency department with acute onset of hand spasms.  Patient was at home with family after Thanksgiving dinner and began having right hand spasms involving the right ring and right little finger.  This was intermittent.  Patient states that it felt like his fingers "locked up".  Shortly later patient began having similar symptoms in his left thumb, again locking up, and having associated left forearm pain.  This had resolved.  Family was concerned that he may be having a stroke, prompting emergency department visit.  Patient states he has been having a normal amount of diarrhea related to his celiac disease.  No nausea or vomiting.  No fevers.  He is eating and drinking well.  Patient denies signs of stroke including: facial droop, slurred speech, aphasia, imbalance/trouble walking.  States that he plays video games for an extensive period of time every week.  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  He has not had any change with his coordination.      Past Medical History:  Diagnosis Date  . Celiac disease   . Chronic kidney disease     There are no active problems to display for this patient.   Past Surgical History:  Procedure Laterality Date  . EYE SURGERY          Home Medications    Prior to Admission medications   Medication Sig Start Date End Date Taking? Authorizing Provider  cyclobenzaprine (FLEXERIL) 10 MG tablet Take 1 tablet (10 mg total) by mouth 3 (three) times daily as needed for muscle spasms. 01/29/15   Carmelina DaneAnderson, Jeffery S, MD  HYDROcodone-acetaminophen (NORCO) 5-325 MG per tablet Take 1-2 tablets by mouth every  4 (four) hours as needed. 01/29/15   Carmelina DaneAnderson, Jeffery S, MD  oxybutynin (DITROPAN) 5 MG tablet Take 5 mg by mouth 3 (three) times daily.    [provider]    Family History Family History  Problem Relation Age of Onset  . Mental illness Mother     Social History Social History   Tobacco Use  . Smoking status: Never Smoker  Substance Use Topics  . Alcohol use: Not on file  . Drug use: Not on file     Allergies   Lipitor [atorvastatin]   Review of Systems Review of Systems  Constitutional: Negative for fever.  HENT: Negative for rhinorrhea and sore throat.   Eyes: Negative for redness.  Respiratory: Negative for cough.   Cardiovascular: Negative for chest pain.  Gastrointestinal: Positive for diarrhea. Negative for abdominal pain, nausea and vomiting.  Genitourinary: Negative for dysuria.  Musculoskeletal: Positive for myalgias. Negative for arthralgias and joint swelling.  Skin: Negative for rash.  Neurological: Positive for weakness ('locking up'). Negative for headaches.     Physical Exam Updated Vital Signs BP 110/79   Pulse 91   Temp 98.7 F (37.1 C) (Oral)   Resp 13   SpO2 94%   Physical Exam  Constitutional: He is oriented to person, place, and time. He appears well-developed and well-nourished.  HENT:  Head: Normocephalic and atraumatic.  Right Ear: Tympanic membrane, external ear and ear canal normal.  Left Ear: Tympanic membrane, external ear and ear canal normal.  Nose: Nose normal.  Mouth/Throat: Uvula is midline, oropharynx is clear and moist and mucous membranes are normal.  Eyes: Pupils are equal, round, and reactive to light. Conjunctivae, EOM and lids are normal.  Neck: Normal range of motion. Neck supple.  Cardiovascular: Normal rate and regular rhythm.  Pulmonary/Chest: Effort normal and breath sounds normal.  Abdominal: Soft. There is no tenderness.  Musculoskeletal: Normal range of motion.       Cervical back: He exhibits  normal range of motion, no tenderness and no bony tenderness.  Neurological: He is alert and oriented to person, place, and time. He has normal strength and normal reflexes. No cranial nerve deficit or sensory deficit. He exhibits normal muscle tone. He displays a negative Romberg sign. Coordination and gait normal. GCS eye subscore is 4. GCS verbal subscore is 5. GCS motor subscore is 6.  Skin: Skin is warm and dry.  Psychiatric: He has a normal mood and affect.  Nursing note and vitals reviewed.    ED Treatments / Results  Labs (all labs ordered are listed, but only abnormal results are displayed) Labs Reviewed  CBC WITH DIFFERENTIAL/PLATELET - Abnormal; Notable for the following components:      Result Value   Neutro Abs 7.9 (*)    All other components within normal limits  BASIC METABOLIC PANEL - Abnormal; Notable for the following components:   Glucose, Bld 157 (*)    All other components within normal limits  MAGNESIUM    EKG None  Radiology No results found.  Procedures Procedures (including critical care time)  Medications Ordered in ED Medications - No data to display   Initial Impression / Assessment and Plan / ED Course  I have reviewed the triage vital signs and the nursing notes.  Pertinent labs & imaging results that were available during my care of the patient were reviewed by me and considered in my medical decision making (see chart for details).     Patient seen and examined.  Low concern for stroke based on initial history.  Will check electrolytes and blood counts.  Discussed with Dr. Judd Lien who will see patient.  Vital signs reviewed and are as follows: BP 110/79   Pulse 91   Temp 98.7 F (37.1 C) (Oral)   Resp 13   SpO2 94%   8:26 PM electrolytes look normal.  Patient recently ate Thanksgiving dinner and this is likely your blood sugar is high.  Low concern for stroke.  Patient seen by Dr. Judd Lien.  Cleared for discharged home at this time.   Patient counseled to return if they have weakness in their arms or legs, slurred speech, trouble walking or talking, confusion, trouble with their balance, or if they have any other concerns. Patient verbalizes understanding and agrees with plan.    Final Clinical Impressions(s) / ED Diagnoses   Final diagnoses:  Carpopedal spasm   Patient with carpopedal spasm, intermittent, now resolved.  Symptoms were bilateral.  Initially brought in for concerns for stroke however symptoms are not suggestive of that.  Letter lites including calcium appear normal.  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  We will have him rest and manage conservatively.  Return instructions as above.  ED Discharge Orders    None       Renne Crigler, Cordelia Poche 10/08/18 2028    Geoffery Lyons, MD 10/09/18  2234  

## 2018-10-08 NOTE — ED Triage Notes (Signed)
Pt presents with intermittent numbness in bilateral hands that started at 1800 tonight. Pt states it started with two of his fingers in his right hand "locking up", but that symptom has since stopped. Pt denies NVD, HA, weakness. Pt also endorses an intermittent sharp pain in his left forearm. Pt A+Ox4, ambulatory with steady gait.

## 2018-10-08 NOTE — ED Notes (Signed)
Patient verbalizes understanding of discharge instructions. Opportunity for questioning and answers were provided. Armband removed by staff, pt discharged from ED.  

## 2019-04-12 DIAGNOSIS — R0600 Dyspnea, unspecified: Secondary | ICD-10-CM

## 2019-04-12 DIAGNOSIS — R0609 Other forms of dyspnea: Secondary | ICD-10-CM

## 2019-04-12 HISTORY — DX: Other forms of dyspnea: R06.09

## 2019-04-12 HISTORY — DX: Dyspnea, unspecified: R06.00

## 2019-06-16 ENCOUNTER — Other Ambulatory Visit: Payer: Self-pay | Admitting: Urology

## 2019-06-21 NOTE — Patient Instructions (Addendum)
YOU NEED TO HAVE A COVID 19 TEST ON__Friday 8/14_____ @_3 :25______, THIS TEST MUST BE DONE BEFORE SURGERY, COME  [address], [place] , [phone]. ONCE YOUR COVID TEST IS COMPLETED, PLEASE BEGIN THE QUARANTINE INSTRUCTIONS AS OUTLINED IN YOUR HANDOUT.                Bobby Osborne   Your procedure is scheduled on: Tuesday 8/18   [facility]  En[facility]ance Report to admitting at 3:00 PM    1 VISITOR IS ALLOWED TO WAIT IN WAITING ROOM  ONLY DAY OF YOUR SURGERY.    Call this number if you have problems the morning of surgery [phone]    Remember: Do not eat food  :After Midnight.  Do not eat food After Midnight. YOU MAY HAVE CLEAR LIQUIDS FROM MIDNIGHT UNTIL 2:00PM. CLEAR LIQUID DIET   Foods Allowed                                                                     Foods Excluded  Coffee and tea, regular and decaf                             liquids that you cannot  Plain Jell-O any favor except red or purple                                           see through such as: Fruit ices (not with fruit pulp)                                     milk, soups, orange juice  Iced Popsicles                                    All solid food Carbonated beverages, regular and diet                                    Cranberry, grape and apple juices Sports drinks like Gatorade Lightly seasoned clear broth or consume(fat free) Sugar, honey syrup  Sample Menu Breakfast                                Lunch                                     Supper Cranberry juice                    Beef broth                            Chicken broth Jell-O  Grape juice                           Apple juice Coffee or tea                        Jell-O                                      Popsicle                                                Coffee or tea                        Coffee or  tea  _____________________________________________________________________    BRUSH YOUR TEETH MORNING OF SURGERY AND RINSE YOUR MOUTH OUT, NO CHEWING GUM CANDY OR MINTS.     Take these medicines the morning of surgery with A SIP OF WATER:  Ditropan                                 You may not have any metal on your body including piercings              Do not wear jewelry, , lotions, powders or , deodorant                          Men may shave face and neck.   Do not bring valuables to the hospital. Bobby Osborne.  Contacts, dentures or bridgework may not be worn into surgery.      Patients discharged the day of surgery will not be allowed to drive home . IF YOU ARE HAVING SURGERY AND GOING HOME THE SAME DAY, YOU MUST HAVE AN ADULT TO DRIVE YOU HOME AND BE WITH YOU FOR 24 HOURS.  YOU MAY GO HOME BY TAXI OR UBER OR ORTHERWISE, BUT AN ADULT MUST ACCOMPANY YOU HOME AND STAY WITH YOU FOR 24 HOURS.  Name and phone number of your driver:  Special Instructions: N/A              Please read over the following fact sheets you were given: _____________________________________________________________________             [facility] - Preparing for Surgery Before surgery, you can play an important role .  Because skin is not sterile, your skin needs to be as free of germs as possible.   You can reduce the number of germs on your skin by washing with CHG (chlorahexidine gluconate) soap before surgery.   CHG is an antiseptic cleaner which kills germs and bonds with the skin to continue killing germs even after washing. Please DO NOT use if you have an allergy to CHG or antibacterial soaps .  If your skin becomes reddened/irritated stop using the CHG and inform your nurse when you arrive at Short Stay. .  You may shave your face/neck. Please follow these instructions carefully:  1.  Shower with CHG Soap the night  before surgery and the   morning of Surgery.  2.  If you choose to wash your hair, wash your hair first as usual with your  normal  shampoo.  3.  After you shampoo, rinse your hair and body thoroughly to remove the  shampoo.                                        4.  Use CHG as you would any other liquid soap.  You can apply chg directly  to the skin and wash                       Gently with a scrungie or clean washcloth.  5.  Apply the CHG Soap to your body ONLY FROM THE NECK DOWN.   Do not use on face/ open                           Wound or open sores. Avoid contact with eyes, ears mouth and genitals (private parts).                       Wash face,  Genitals (private parts) with your normal soap.             6.  Wash thoroughly, paying special attention to the area where your surgery  will be performed.  7.  Thoroughly rinse your body with warm water from the neck down.  8.  DO NOT shower/wash with your normal soap after using and rinsing off  the CHG Soap.             9.  Pat yourself dry with a clean towel.            10.  Wear clean pajamas.            11.  Place clean sheets on your bed the night of your first shower and do not  sleep with pets. Day of Surgery : Do not apply any lotions/deodorants the morning of surgery.  Please wear clean clothes to the hospital/surgery center.  FAILURE TO FOLLOW THESE INSTRUCTIONS MAY RESULT IN THE CANCELLATION OF YOUR SURGERY PATIENT SIGNATURE_________________________________  NURSE SIGNATURE__________________________________  ________________________________________________________________________

## 2019-06-22 ENCOUNTER — Encounter (HOSPITAL_COMMUNITY)
Admission: RE | Admit: 2019-06-22 | Discharge: 2019-06-22 | Disposition: A | Discharge status: Home / Self Care | Payer: Medicare Other | Source: Ambulatory Visit | Attending: Urology | Admitting: Urology

## 2019-06-22 ENCOUNTER — Encounter (HOSPITAL_COMMUNITY): Payer: Self-pay

## 2019-06-22 ENCOUNTER — Other Ambulatory Visit: Payer: Self-pay

## 2019-06-22 DIAGNOSIS — Z01812 Encounter for preprocedural laboratory examination: Secondary | ICD-10-CM | POA: Diagnosis not present

## 2019-06-22 DIAGNOSIS — N4340 Spermatocele of epididymis, unspecified: Secondary | ICD-10-CM | POA: Insufficient documentation

## 2019-06-22 DIAGNOSIS — R3129 Other microscopic hematuria: Secondary | ICD-10-CM | POA: Diagnosis not present

## 2019-06-22 LAB — BASIC METABOLIC PANEL
Anion gap: 6 (ref 5–15)
BUN: 14 mg/dL (ref 8–23)
CO2: 30 mmol/L (ref 22–32)
Calcium: 9.2 mg/dL (ref 8.9–10.3)
Chloride: 106 mmol/L (ref 98–111)
Creatinine, Ser: 0.9 mg/dL (ref 0.61–1.24)
GFR calc Af Amer: >60 mL/min (ref >60)
GFR calc non Af Amer: >60 mL/min (ref >60)
Glucose, Bld: 109 mg/dL — ABNORMAL HIGH (ref 70–99)
Potassium: 4.5 mmol/L (ref 3.5–5.1)
Sodium: 142 mmol/L (ref 135–145)

## 2019-06-22 LAB — CBC
HCT: 44.4 % (ref 39.0–52.0)
Hemoglobin: 15 g/dL (ref 13.0–17.0)
MCH: 31.5 pg (ref 26.0–34.0)
MCHC: 33.8 g/dL (ref 30.0–36.0)
MCV: 93.3 fL (ref 80.0–100.0)
Platelets: 231 10*3/uL (ref 150–400)
RBC: 4.76 MIL/uL (ref 4.22–5.81)
RDW: 13.2 % (ref 11.5–15.5)
WBC: 4 10*3/uL (ref 4.0–10.5)
nRBC: 0 % (ref 0.0–0.2)

## 2019-06-25 ENCOUNTER — Inpatient Hospital Stay (HOSPITAL_COMMUNITY): Admission: RE | Admit: 2019-06-25 | Payer: Medicare Other | Source: Ambulatory Visit

## 2019-07-14 ENCOUNTER — Other Ambulatory Visit: Payer: Self-pay

## 2019-07-14 ENCOUNTER — Encounter (HOSPITAL_COMMUNITY): Payer: Self-pay

## 2019-07-14 ENCOUNTER — Encounter (HOSPITAL_COMMUNITY)
Admission: RE | Admit: 2019-07-14 | Discharge: 2019-07-14 | Disposition: A | Discharge status: Home / Self Care | Payer: Medicare Other | Source: Ambulatory Visit | Attending: Urology | Admitting: Urology

## 2019-07-14 DIAGNOSIS — Z01812 Encounter for preprocedural laboratory examination: Secondary | ICD-10-CM | POA: Diagnosis not present

## 2019-07-14 HISTORY — DX: Nephrotic syndrome with unspecified morphologic changes: N04.9

## 2019-07-14 HISTORY — DX: Personal history of antineoplastic chemotherapy: Z92.21

## 2019-07-14 HISTORY — DX: Pure hypercholesterolemia, unspecified: E78.00

## 2019-07-14 LAB — COMPREHENSIVE METABOLIC PANEL
ALT: 26 U/L (ref 0–44)
AST: 24 U/L (ref 15–41)
Albumin: 3.2 g/dL — ABNORMAL LOW (ref 3.5–5.0)
Alkaline Phosphatase: 65 U/L (ref 38–126)
Anion gap: 7 (ref 5–15)
BUN: 14 mg/dL (ref 8–23)
CO2: 29 mmol/L (ref 22–32)
Calcium: 9 mg/dL (ref 8.9–10.3)
Chloride: 104 mmol/L (ref 98–111)
Creatinine, Ser: 0.79 mg/dL (ref 0.61–1.24)
GFR calc Af Amer: >60 mL/min (ref >60)
GFR calc non Af Amer: >60 mL/min (ref >60)
Glucose, Bld: 102 mg/dL — ABNORMAL HIGH (ref 70–99)
Potassium: 4.3 mmol/L (ref 3.5–5.1)
Sodium: 140 mmol/L (ref 135–145)
Total Bilirubin: 0.4 mg/dL (ref 0.3–1.2)
Total Protein: 5.7 g/dL — ABNORMAL LOW (ref 6.5–8.1)

## 2019-07-14 LAB — CBC
HCT: 43.5 % (ref 39.0–52.0)
Hemoglobin: 15 g/dL (ref 13.0–17.0)
MCH: 32.1 pg (ref 26.0–34.0)
MCHC: 34.5 g/dL (ref 30.0–36.0)
MCV: 93.1 fL (ref 80.0–100.0)
Platelets: 214 10*3/uL (ref 150–400)
RBC: 4.67 MIL/uL (ref 4.22–5.81)
RDW: 12.8 % (ref 11.5–15.5)
WBC: 4.4 10*3/uL (ref 4.0–10.5)
nRBC: 0 % (ref 0.0–0.2)

## 2019-07-14 NOTE — Progress Notes (Signed)
Need orders for surgery on 07-23-2019. Thanks

## 2019-07-14 NOTE — Progress Notes (Addendum)
PCP - [place] [facility] -   Chest x-ray - 05-28-2019 CARE EVERYWHERE EKG - Epic  2020 Stress Test -  ECHO - 05-28-2019 Epic CARE EVERYWHERE Cardiac Cath -   Sleep Study -  CPAP -   Fasting Blood Sugar -  Checks Blood Sugar _____ times a day  Blood Thinner Instructions: Aspirin Instructions: Last Dose:  Anesthesia review:   PATIENT REPORTS HX OF DOE WHEN JOGGING 7 MILES A FEW TIMES A WEEK UPHILL IN 90 DEGREE WEATHER. UNDERWENT CARDIAC EVAL. PATIENT REPORTS ECHO WAS NEGATIVE. PATIENT BELIEVES DOE WAS RELATED TO CONDITION OF HIS EXERCISE ROUTINE AND HAS SINCE CHANGED HIS ROUTINE AND REPORTS RESOLUTION OF DOE.   Patient denies shortness of breath, fever, cough and chest pain at PAT appointment   Patient verbalized understanding of instructions that were given to them at the PAT appointment. Patient was also instructed that they will need to review over the PAT instructions again at home before surgery.

## 2019-07-14 NOTE — Patient Instructions (Addendum)
DUE TO COVID-19 ONLY ONE VISITOR IS ALLOWED TO COME WITH YOU AND STAY IN THE WAITING ROOM ONLY DURING PRE OP AND PROCEDURE DAY OF SURGERY. THE 1 VISITOR MAY VISIT WITH YOU AFTER SURGERY IN YOUR PRIVATE ROOM DURING VISITING HOURS ONLY!  YOU NEED TO HAVE A COVID 19 TEST ON____Tuesday 9-8-2020___ @___1 :00PM____, THIS TEST MUST BE DONE BEFORE SURGERY, COME  [address], [place] , [id number].  [facility]) ONCE YOUR COVID TEST IS COMPLETED, PLEASE BEGIN THE QUARANTINE INSTRUCTIONS AS OUTLINED IN YOUR HANDOUT.                Bobby Osborne    Your procedure is scheduled on: 07-23-2019   Report to [facility] Main  Entrance   Report to admitting at 11:10AM     Call this number if you have problems the morning of surgery [phone]    Remember: Do not eat food After Midnight. YOU MAY HAVE CLEAR LIQUIDS FROM MIDNIGHT UNTIL 7:00AM.  Nothing by mouth after 7:00AM    CLEAR LIQUID DIET   Foods Allowed                                                                     Foods Excluded  Coffee and tea, regular and decaf                             liquids that you cannot  Plain Jell-O any favor except red or purple                                           see through such as: Fruit ices (not with fruit pulp)                                     milk, soups, orange juice  Iced Popsicles                                    All solid food Carbonated beverages, regular and diet                                    Cranberry, grape and apple juices Sports drinks like Gatorade Lightly seasoned clear broth or consume(fat free) Sugar, honey syrup  Sample Menu Breakfast                                Lunch                                     Supper Cranberry juice                    Beef broth  Chicken broth Jell-O                                     Grape juice                           Apple juice Coffee or tea                        Jell-O                                       Popsicle                                                Coffee or tea                        Coffee or tea  _____________________________________________________________________    BRUSH YOUR TEETH MORNING OF SURGERY AND RINSE YOUR MOUTH OUT, NO CHEWING GUM CANDY OR MINTS.     Take these medicines the morning of surgery with A SIP OF WATER: Ezetimibe                                  You may not have any metal on your body including hair pins and              piercings  Do not wear jewelry, make-up, lotions, powders or perfumes, deodorant                      Men may shave face and neck.   Do not bring valuables to the hospital. [place] IS NOT             RESPONSIBLE   FOR VALUABLES.  Contacts, dentures or bridgework may not be worn into surgery.      Patients discharged the day of surgery will not be allowed to drive home. IF YOU ARE HAVING SURGERY AND GOING HOME THE SAME DAY, YOU MUST HAVE AN ADULT TO DRIVE YOU HOME AND BE WITH YOU FOR 24 HOURS. YOU MAY GO HOME BY TAXI OR UBER OR ORTHERWISE, BUT AN ADULT MUST ACCOMPANY YOU HOME AND STAY WITH YOU FOR 24 HOURS.  Name and phone number of your driver:  Special Instructions: N/A              Please read over the following fact sheets you were given: _____________________________________________________________________             [facility] - Preparing for Surgery Before surgery, you can play an important role.  Because skin is not sterile, your skin needs to be as free of germs as possible.  You can reduce the number of germs on your skin by washing with CHG (chlorahexidine gluconate) soap before surgery.  CHG is an antiseptic cleaner which kills germs and bonds with the skin to continue killing germs even after washing. Please DO NOT use if you have an allergy to CHG or antibacterial soaps.  If your skin becomes reddened/irritated stop using  the CHG and inform your nurse when you arrive at  Short Stay. Do not shave (including legs and underarms) for at least 48 hours prior to the first CHG shower.  You may shave your face/neck. Please follow these instructions carefully:  1.  Shower with CHG Soap the night before surgery and the  morning of Surgery.  2.  If you choose to wash your hair, wash your hair first as usual with your  normal  shampoo.  3.  After you shampoo, rinse your hair and body thoroughly to remove the  shampoo.                           4.  Use CHG as you would any other liquid soap.  You can apply chg directly  to the skin and wash                       Gently with a scrungie or clean washcloth.  5.  Apply the CHG Soap to your body ONLY FROM THE NECK DOWN.   Do not use on face/ open                           Wound or open sores. Avoid contact with eyes, ears mouth and genitals (private parts).                       Wash face,  Genitals (private parts) with your normal soap.             6.  Wash thoroughly, paying special attention to the area where your surgery  will be performed.  7.  Thoroughly rinse your body with warm water from the neck down.  8.  DO NOT shower/wash with your normal soap after using and rinsing off  the CHG Soap.                9.  Pat yourself dry with a clean towel.            10.  Wear clean pajamas.            11.  Place clean sheets on your bed the night of your first shower and do not  sleep with pets. Day of Surgery : Do not apply any lotions/deodorants the morning of surgery.  Please wear clean clothes to the hospital/surgery center.  FAILURE TO FOLLOW THESE INSTRUCTIONS MAY RESULT IN THE CANCELLATION OF YOUR SURGERY PATIENT SIGNATURE_________________________________  NURSE SIGNATURE__________________________________  ________________________________________________________________________

## 2019-07-20 ENCOUNTER — Other Ambulatory Visit (HOSPITAL_COMMUNITY)
Admission: RE | Admit: 2019-07-20 | Discharge: 2019-07-20 | Disposition: A | Discharge status: Home / Self Care | Payer: Medicare Other | Source: Ambulatory Visit | Attending: Urology | Admitting: Urology

## 2019-07-20 DIAGNOSIS — Z01812 Encounter for preprocedural laboratory examination: Secondary | ICD-10-CM | POA: Insufficient documentation

## 2019-07-20 DIAGNOSIS — Z20828 Contact with and (suspected) exposure to other viral communicable diseases: Secondary | ICD-10-CM | POA: Diagnosis not present

## 2019-07-22 LAB — NOVEL CORONAVIRUS, NAA (HOSP ORDER, SEND-OUT TO REF LAB; TAT 18-24 HRS): SARS-CoV-2, NAA: NOT DETECTED

## 2019-07-23 ENCOUNTER — Other Ambulatory Visit: Payer: Self-pay

## 2019-07-23 ENCOUNTER — Encounter (HOSPITAL_COMMUNITY): Payer: Self-pay | Admitting: *Deleted

## 2019-07-23 ENCOUNTER — Ambulatory Visit (HOSPITAL_COMMUNITY): Payer: Medicare Other | Admitting: Physician Assistant

## 2019-07-23 ENCOUNTER — Ambulatory Visit (HOSPITAL_COMMUNITY)
Admission: RE | Admit: 2019-07-23 | Discharge: 2019-07-23 | Disposition: A | Discharge status: Home / Self Care | Payer: Medicare Other | Attending: Urology | Admitting: Urology

## 2019-07-23 ENCOUNTER — Ambulatory Visit (HOSPITAL_COMMUNITY): Payer: Medicare Other | Admitting: Certified Registered"

## 2019-07-23 ENCOUNTER — Encounter (HOSPITAL_COMMUNITY)
Admission: RE | Disposition: A | Discharge status: Home / Self Care | Payer: Self-pay | Source: Home / Self Care | Attending: Urology

## 2019-07-23 DIAGNOSIS — N189 Chronic kidney disease, unspecified: Secondary | ICD-10-CM | POA: Diagnosis not present

## 2019-07-23 DIAGNOSIS — N4342 Spermatocele of epididymis, multiple: Secondary | ICD-10-CM | POA: Insufficient documentation

## 2019-07-23 DIAGNOSIS — R3912 Poor urinary stream: Secondary | ICD-10-CM | POA: Insufficient documentation

## 2019-07-23 DIAGNOSIS — N3289 Other specified disorders of bladder: Secondary | ICD-10-CM | POA: Diagnosis not present

## 2019-07-23 DIAGNOSIS — E7800 Pure hypercholesterolemia, unspecified: Secondary | ICD-10-CM | POA: Diagnosis not present

## 2019-07-23 DIAGNOSIS — R3129 Other microscopic hematuria: Secondary | ICD-10-CM | POA: Diagnosis not present

## 2019-07-23 DIAGNOSIS — Z9221 Personal history of antineoplastic chemotherapy: Secondary | ICD-10-CM | POA: Diagnosis not present

## 2019-07-23 HISTORY — PX: SPERMATOCELECTOMY: SHX2420

## 2019-07-23 HISTORY — PX: CYSTOSCOPY: SHX5120

## 2019-07-23 SURGERY — CYSTOSCOPY, FLEXIBLE
Anesthesia: General

## 2019-07-23 MED ORDER — CEFAZOLIN SODIUM-DEXTROSE 2-4 GM/100ML-% IV SOLN
INTRAVENOUS | Status: AC
  Filled 2019-07-23: qty 100

## 2019-07-23 MED ORDER — EPHEDRINE SULFATE-NACL 50-0.9 MG/10ML-% IV SOSY
PREFILLED_SYRINGE | INTRAVENOUS | Status: DC | PRN
  Administered 2019-07-23: 10 mg via INTRAVENOUS
  Administered 2019-07-23: 5 mg via INTRAVENOUS
  Administered 2019-07-23 (×2): 10 mg via INTRAVENOUS

## 2019-07-23 MED ORDER — ONDANSETRON HCL 4 MG/2ML IJ SOLN
INTRAMUSCULAR | Status: AC
  Filled 2019-07-23: qty 2

## 2019-07-23 MED ORDER — OXYCODONE HCL 5 MG/5ML PO SOLN: 5.0000 mg | Freq: Once | ORAL | Status: DC | PRN

## 2019-07-23 MED ORDER — OXYCODONE HCL 5 MG PO TABS: 5.0000 mg | ORAL_TABLET | Freq: Once | ORAL | Status: DC | PRN

## 2019-07-23 MED ORDER — HYDROCODONE-ACETAMINOPHEN 5-325 MG PO TABS: 1.0000 | ORAL_TABLET | ORAL | 0 refills | Status: DC | PRN

## 2019-07-23 MED ORDER — 0.9 % SODIUM CHLORIDE (POUR BTL) OPTIME
TOPICAL | Status: DC | PRN
  Administered 2019-07-23: 1000 mL

## 2019-07-23 MED ORDER — BUPIVACAINE HCL (PF) 0.25 % IJ SOLN
INTRAMUSCULAR | Status: DC | PRN
  Administered 2019-07-23: 6 mL

## 2019-07-23 MED ORDER — MIDAZOLAM HCL 2 MG/2ML IJ SOLN
INTRAMUSCULAR | Status: AC
  Filled 2019-07-23: qty 2

## 2019-07-23 MED ORDER — MIDAZOLAM HCL 2 MG/2ML IJ SOLN
INTRAMUSCULAR | Status: DC | PRN
  Administered 2019-07-23 (×2): 1 mg via INTRAVENOUS

## 2019-07-23 MED ORDER — FENTANYL CITRATE (PF) 100 MCG/2ML IJ SOLN: 25.0000 ug | INTRAMUSCULAR | Status: DC | PRN

## 2019-07-23 MED ORDER — DEXAMETHASONE SODIUM PHOSPHATE 10 MG/ML IJ SOLN
INTRAMUSCULAR | Status: AC
  Filled 2019-07-23: qty 1

## 2019-07-23 MED ORDER — ONDANSETRON HCL 4 MG/2ML IJ SOLN
INTRAMUSCULAR | Status: DC | PRN
  Administered 2019-07-23: 4 mg via INTRAVENOUS

## 2019-07-23 MED ORDER — CEFAZOLIN SODIUM-DEXTROSE 2-4 GM/100ML-% IV SOLN
2.0000 g | Freq: Once | INTRAVENOUS | Status: AC
  Administered 2019-07-23: 2 g via INTRAVENOUS

## 2019-07-23 MED ORDER — BUPIVACAINE HCL (PF) 0.25 % IJ SOLN
INTRAMUSCULAR | Status: AC
  Filled 2019-07-23: qty 30

## 2019-07-23 MED ORDER — FENTANYL CITRATE (PF) 100 MCG/2ML IJ SOLN
INTRAMUSCULAR | Status: AC
  Filled 2019-07-23: qty 2

## 2019-07-23 MED ORDER — ONDANSETRON HCL 4 MG/2ML IJ SOLN: 4.0000 mg | Freq: Once | INTRAMUSCULAR | Status: DC | PRN

## 2019-07-23 MED ORDER — PROPOFOL 10 MG/ML IV BOLUS
INTRAVENOUS | Status: AC
  Filled 2019-07-23: qty 20

## 2019-07-23 MED ORDER — FENTANYL CITRATE (PF) 100 MCG/2ML IJ SOLN
INTRAMUSCULAR | Status: DC | PRN
  Administered 2019-07-23: 25 ug via INTRAVENOUS
  Administered 2019-07-23: 50 ug via INTRAVENOUS
  Administered 2019-07-23 (×2): 25 ug via INTRAVENOUS
  Administered 2019-07-23: 50 ug via INTRAVENOUS
  Administered 2019-07-23: 25 ug via INTRAVENOUS

## 2019-07-23 MED ORDER — DEXAMETHASONE SODIUM PHOSPHATE 10 MG/ML IJ SOLN
INTRAMUSCULAR | Status: DC | PRN
  Administered 2019-07-23: 8 mg via INTRAVENOUS

## 2019-07-23 MED ORDER — LACTATED RINGERS IV SOLN
INTRAVENOUS | Status: DC
  Administered 2019-07-23: 12:00:00 via INTRAVENOUS

## 2019-07-23 MED ORDER — LIDOCAINE 2% (20 MG/ML) 5 ML SYRINGE
INTRAMUSCULAR | Status: DC | PRN
  Administered 2019-07-23: 60 mg via INTRAVENOUS

## 2019-07-23 MED ORDER — PROPOFOL 10 MG/ML IV BOLUS
INTRAVENOUS | Status: DC | PRN
  Administered 2019-07-23: 120 mg via INTRAVENOUS

## 2019-07-23 SURGICAL SUPPLY — 33 items
BAG URO CATCHER STRL LF (MISCELLANEOUS) ×3 IMPLANT
BASKET ZERO TIP NITINOL 2.4FR (BASKET) IMPLANT
BLADE HEX COATED 2.75 (ELECTRODE) ×3 IMPLANT
BNDG GAUZE ELAST 4 BULKY (GAUZE/BANDAGES/DRESSINGS) ×3 IMPLANT
CATH INTERMIT  6FR 70CM (CATHETERS) IMPLANT
CLOTH BEACON ORANGE TIMEOUT ST (SAFETY) ×3 IMPLANT
COVER SURGICAL LIGHT HANDLE (MISCELLANEOUS) ×3 IMPLANT
COVER WAND RF STERILE (DRAPES) IMPLANT
DRAPE LAPAROTOMY T 98X78 PEDS (DRAPES) ×3 IMPLANT
ELECT REM PT RETURN 15FT ADLT (MISCELLANEOUS) ×3 IMPLANT
GLOVE BIOGEL M STRL SZ7.5 (GLOVE) ×3 IMPLANT
GOWN STRL REUS W/TWL LRG LVL3 (GOWN DISPOSABLE) ×3 IMPLANT
GOWN STRL REUS W/TWL XL LVL3 (GOWN DISPOSABLE) ×3 IMPLANT
GUIDEWIRE ANG ZIPWIRE 038X150 (WIRE) IMPLANT
GUIDEWIRE STR DUAL SENSOR (WIRE) ×3 IMPLANT
KIT BASIN OR (CUSTOM PROCEDURE TRAY) ×3 IMPLANT
KIT TURNOVER KIT A (KITS) IMPLANT
MANIFOLD NEPTUNE II (INSTRUMENTS) ×3 IMPLANT
NEEDLE HYPO 22GX1.5 SAFETY (NEEDLE) IMPLANT
NS IRRIG 1000ML POUR BTL (IV SOLUTION) ×3 IMPLANT
PACK CYSTO (CUSTOM PROCEDURE TRAY) ×3 IMPLANT
PACK GENERAL/GYN (CUSTOM PROCEDURE TRAY) ×3 IMPLANT
SUPPORT SCROTAL LG STRP (MISCELLANEOUS) ×3 IMPLANT
SUT CHROMIC 3 0 SH 27 (SUTURE) ×9 IMPLANT
SUT CHROMIC 4 0 RB 1X27 (SUTURE) IMPLANT
SUT VIC AB 2-0 UR5 27 (SUTURE) IMPLANT
SUT VIC AB 2-0 UR6 27 (SUTURE) ×6 IMPLANT
SUT VICRYL 0 TIES 12 18 (SUTURE) IMPLANT
SYR CONTROL 10ML LL (SYRINGE) IMPLANT
TOWEL OR 17X26 10 PK STRL BLUE (TOWEL DISPOSABLE) ×6 IMPLANT
TUBING CONNECTING 10 (TUBING) ×3 IMPLANT
TUBING UROLOGY SET (TUBING) IMPLANT
WATER STERILE IRR 1000ML POUR (IV SOLUTION) ×3 IMPLANT

## 2019-07-23 NOTE — Op Note (Addendum)
PATIENT:  Bobby GalloPatrick J Osborne  PRE-OPERATIVE DIAGNOSIS:  Bilateral spermatoceles Weak stream, frequency  Microscopic hematuria   POST-OPERATIVE DIAGNOSIS: Same  PROCEDURE:  Bilateral spermatocelectomy Cystourethroscopy  SURGEON:  Jerilee FieldMatthew Lavell Ridings, MD  RESIDENT: Roxanne GatesElizabeth E Snyder, MD  INDICATION: Bobby Osborne is a 68 y.o. male with a history of bilateral spermatoceles causing pain and discomfort presenting for spermatocelectomy. He also has a history of LUTS and MH for which cystourethroscopy is indicated. Risks and benefits discussed. Consent signed.   Findings: On exam the penis was circumcised and without mass or lesion.  The glans and meatus appeared normal.  The testicles were descended bilaterally and palpably normal with cystic structure superior to both testicles.  Once the testicles were delivered, simple multicystic spermatoceles were noted bilaterally.  On cystoscopy the urethra was unremarkable, the prostate was short and just mildly obstructive with a higher bladder neck.  The trigone and ureteral orifice ease were in their normal orthotopic position with clear efflux.  There was no stone or foreign body in the bladder.  There was mild to moderate bladder trabeculation.  ANESTHESIA:  General  EBL:  Minimal  DRAINS: None  LOCAL MEDICATIONS USED:  6 cc 0.25% marcaine  SPECIMEN:  None  Description of procedure: After informed consent the patient was taken to the operating room and placed on the table in a supine position. General anesthesia was then administered. Once fully anesthetized the patient was moved to the supine position and the genitalia were sterilely prepped and draped in standard fashion. An official timeout was then performed.  Local anesthesia was injected along midline scrotal incision mark. We began by making a midline scrotal incision along marking. We then dissected through dartos fascia on the left side until the left testes was delivered. We  encountered a hydrocele and underlying spermatoceles. The hydrocele sac was opened, and the spermatoceles were carefully dissected down to their attachments to the epididymis. Spermatoceles were completely excised and a floppy adherent epididymal head was excised as well. The epididymal stump was oversewn with figure of 8 suture. We ensured excellent hemostasis. The tunica albuginea defect created during excision was closed with a running 2-0 vicryl suture. The remaining hydrocele sac was quite small and therefore did not require eversion or closure.  We then turned our attention to the right side. We dissected through dartos fascia on the right until the right testis was delivered. We encountered a hydrocele and underlying spermatoceles. The hydrocele sac was opened, and the spermatoceles were carefully dissected down to their attachments. Spermatoceles were completely excised down to their origin as the head of the epididymis. We ensured excellent hemostasis. The tunica albuginea defect was closed with a running 2-0 vicryl suture. The hydrocele sac on the right was everted around the spermatic cord and resewn to itself in everted fashion via Jaboulay's technique.   We then closed the dartos layer with running 2-0 vicryl suture, ensuring that septum was included in suture to compartmentalize each testis into its appropriate hemiscrotum. We closed scrotal skin with 3-0 chromic in running horizontal fashion.   We then proceeded with cystourethroscopy. Flexible cystoscope was inserted per urethra with copious lubrication and normal saline running. This revealed mildly trabeculated bladder. Bladder and urethra otherwise grossly normal with no stones, masses, or lesions. No significant prostatic hypertrophy and no median lobe. Bilateral ureteral orifices visualized in appropriate anatomic locations. Cystoscope was removed.   After closure, we placed fluffs and jock strap over scrotum.   The patient tolerated  the  procedure well.   PLAN OF CARE: Discharge to home after PACU  PATIENT DISPOSITION:  PACU - hemodynamically stable.

## 2019-07-23 NOTE — Anesthesia Preprocedure Evaluation (Addendum)
Anesthesia Evaluation  Patient identified by MRN, date of birth, ID band Patient awake    Reviewed: Allergy & Precautions, NPO status , Patient's Chart, lab work & pertinent test results  History of Anesthesia Complications Negative for: history of anesthetic complications  Airway Mallampati: II  TM Distance: >3 FB Neck ROM: Full    Dental  (+) Teeth Intact   Pulmonary neg pulmonary ROS,    Pulmonary exam normal        Cardiovascular negative cardio ROS Normal cardiovascular exam     Neuro/Psych negative neurological ROS  negative psych ROS   GI/Hepatic negative GI ROS, Neg liver ROS,   Endo/Other  negative endocrine ROS  Renal/GU Renal InsufficiencyRenal disease  negative genitourinary   Musculoskeletal negative musculoskeletal ROS (+)   Abdominal   Peds  Hematology negative hematology ROS (+)   Anesthesia Other Findings Echo 05/28/19:  SUMMARY The left ventricular size is normal. There is normal left ventricular wall thickness. Left ventricular systolic function is normal. LV ejection fraction = 60-65%. The left ventricular wall motion is normal. Left ventricular filling pattern is normal. The right ventricle is normal in size and function. There is no significant valvular stenosis or regurgitation. IVC size was normal. There is no pericardial effusion.  Reproductive/Obstetrics                            Anesthesia Physical Anesthesia Plan  ASA: II  Anesthesia Plan: General   Post-op Pain Management:    Induction: Intravenous  PONV Risk Score and Plan: Ondansetron, Dexamethasone, Treatment may vary due to age or medical condition and Midazolam  Airway Management Planned: LMA  Additional Equipment: None  Intra-op Plan:   Post-operative Plan: Extubation in OR  Informed Consent: I have reviewed the patients History and Physical, chart, labs and discussed the procedure  including the risks, benefits and alternatives for the proposed anesthesia with the patient or authorized representative who has indicated his/her understanding and acceptance.     Dental advisory given  Plan Discussed with:   Anesthesia Plan Comments:        Anesthesia Quick Evaluation

## 2019-07-23 NOTE — Discharge Instructions (Signed)
Spermatocelectomy, Adult, Care After This sheet gives you information about how to care for yourself after your procedure. Your health care provider may also give you more specific instructions. If you have problems or questions, contact your health care provider. What can I expect after the procedure? After your procedure, it is common to have mild discomfort, swelling, and bruising in the pouch that holds your testicles (scrotum).  Follow these instructions at home: Bathing  You may remove the dressing tomorrow and shower. Don't spray the scrotum directly. Let water run off.   Do not take baths or go swimming.  Wear an athletic support strap or very supportive underwear, take it off when you shower or take a bath. Incision care   Follow instructions from your health care provider about how to take care of your incision. Make sure you: ? Wash your hands with soap and water before you change your bandage (dressing). If soap and water are not available, use hand sanitizer. ? Change your dressing as told by your health care provider. ? Leave stitches (sutures) in place.  Check your incision and scrotum every day for signs of infection. Check for: ? More redness, swelling, or pain. ? Blood or fluid. ? Warmth. ? Pus or a bad smell. Managing pain, stiffness, and swelling  If directed, apply ice to the injured area: ? Put ice in a plastic bag. ? Place a towel between your skin and the bag. ? Leave the ice on for 20 minutes, 2-3 times per day. Driving  Do not drive for 24 hours if you were given a sedative.  Do not drive or use heavy machinery while taking prescription pain medicine.  Ask your health care provider when it is safe to drive. Activity  Do not do any activities that require great strength and energy (are vigorous) for as long as told by your health care provider.  Return to your normal activities as told by your health care provider. Ask your health care provider what  activities are safe for you.  Do not lift anything that is heavier than 10 lb (4.5 kg) until your health care provider says that it is safe. General instructions  Take over-the-counter and prescription medicines only as told by your health care provider.  Keep all follow-up visits as told by your health care provider. This is important.  If you were given an athletic support strap, wear it as told by your health care provider.  If you had a drain put in during the procedure, you will need to return for a follow-up visit to have it removed. Contact a health care provider if:  Your pain is not controlled with medicine.  You have more redness or swelling around your scrotum.  You have blood or fluid coming from your scrotum.  Your incision feels warm to the touch.  You have pus or a bad smell coming from your scrotum.  You have a fever.   A spermatocele is a fluid-filled sac (cyst) inside the scrotum. This type of cyst often forms at the top of the testicle where sperm is stored (epididymis).  Spermatoceles are usually painless. Most cysts are small, but they can grow larger. Spermatoceles are not cancerous (are benign). What are the causes? The cause of this condition is not known. What are the signs or symptoms? In most cases, small cysts do not cause symptoms. If you do have symptoms, they may include:  Dull pain.  A feeling of heaviness.  An enlargement of  your scrotum, if the cyst is large. How is this diagnosed? This condition is diagnosed based on a physical exam. You or your health care provider may notice the cyst when feeling your scrotum. Your provider may shine a light through (transilluminate) your scrotum to see if light will pass through the cyst. You may also have an ultrasound of your scrotum to rule out a tumor. How is this treated? Small spermatoceles do not need to be treated. If the spermatocele has grown large or is uncomfortable, surgery to remove the  cyst may be recommended. Follow these instructions at home:  Watch your spermatocele for any changes.  Keep all follow-up visits as told by your health care provider. This is important. Contact a health care provider if:  Your spermatocele gets larger.  You have pain in your scrotum.  Your spermatocele comes back after treatment. This information is not intended to replace advice given to you by your health care provider. Make sure you discuss any questions you have with your health care provider. Document Released: 02/19/2016 Document Revised: 10/10/2017 Document Reviewed: 11/12/2015 Elsevier Patient Education  2020 [employer]

## 2019-07-23 NOTE — H&P (Signed)
H&P  Chief Complaint: Lower urinary tract symptoms, microscopic hematuria, bilateral spermatoceles  History of Present Illness: Bobby Osborne is a 68 year old male with bothersome bilateral spermatoceles.  They have gotten larger and get in the way of his activities and cause some discomfort.  He has been monitoring those for quite some time and elected to proceed with repair.  He also has a history of frequency, urgency, nocturia and a weak stream.  He is on oxybutynin.  He has a history of peritoneal area and microscopic hematuria.  A CT scan was benign on 06/08/2019.  The spermatoceles were seen on the CT.  He presents today for bilateral spermatocelectomy as well as flexible cystoscopy so that we could evaluate for causes of hematuria as well as his lower urinary tract symptoms.  We have talked before about UroLift but discussed we would not be doing UroLift today.  We also talked about resume and this might be a good option.  Past Medical History:  Diagnosis Date  . Celiac disease   . Chronic kidney disease   . Dyspnea on exertion 04/2019   PCP SENT FOR CARDIAC EVAL, UNDERWENT ECHO TTE WHICH WAS UNREMARKABLE, PATIENT REPORTS IMPROVEMENT NOW SINCE CARDIAC EVAL .    Marland Kitchen History of chemotherapy    FOR TREATMENT OF NEPRHOTIC SYNDROME   . Hypercholesteremia   . Nephrotic syndrome    DX AGE 46    Past Surgical History:  Procedure Laterality Date  . COLONOSCOPY  2019  . EYE SURGERY      Home Medications:  Medications Prior to Admission  Medication Sig Dispense Refill Last Dose  . Calcium Carb-Cholecalciferol (CALCIUM 600 + D PO) Take 1 tablet by mouth daily.   Past Month at Unknown time  . Carboxymethylcellul-Glycerin (REFRESH RELIEVA PF OP) Place 1 drop into both eyes 2 (two) times a week.   Past Month at Unknown time  . ezetimibe (ZETIA) 10 MG tablet Take 10 mg by mouth daily.   Past Month at Unknown time  . Multiple Vitamin (MULTIVITAMIN WITH MINERALS) TABS tablet Take 1 tablet by mouth  daily.   Past Month at Unknown time  . Multiple Vitamins-Minerals (PRESERVISION AREDS 2 PO) Take 1 tablet by mouth daily.   Past Month at Unknown time  . OVER THE COUNTER MEDICATION Take 1 capsule by mouth daily. Hydro Eyes   Past Week at Unknown time  . oxybutynin (DITROPAN) 5 MG tablet Take 5 mg by mouth daily.    Past Week at Unknown time   Allergies:  Allergies  Allergen Reactions  . Lipitor [Atorvastatin] Other (See Comments)    Affects liver enzymes   . Pravastatin Other (See Comments)    Vision    Family History  Problem Relation Age of Onset  . Mental illness Mother    Social History:  reports that he has never smoked. He has never used smokeless tobacco. He reports that he does not drink alcohol or use drugs.  ROS: A complete review of systems was performed.  All systems are negative except for pertinent findings as noted. Review of Systems  All other systems reviewed and are negative.    Physical Exam:  Vital signs in last 24 hours: Temp:  [97.9 F (36.6 C)] 97.9 F (36.6 C) (09/11 1130) Pulse Rate:  [68] 68 (09/11 1130) Resp:  [18] 18 (09/11 1130) BP: (124)/(72) 124/72 (09/11 1130) SpO2:  [100 %] 100 % (09/11 1130) Weight:  [76.3 kg] 76.3 kg (09/11 1151) General:  Alert and oriented,  No acute distress HEENT: Normocephalic, atraumatic Cardiovascular: Regular rate and rhythm Lungs: Regular rate and effort Abdomen: Soft, nontender, nondistended, no abdominal masses Back: No CVA tenderness Extremities: No edema Neurologic: Grossly intact  Laboratory Data:  No results found for this or any previous visit (from the past 24 hour(s)). Recent Results (from the past 240 hour(s))  Novel Coronavirus, NAA (Hosp order, Send-out to Ref Lab; TAT 18-24 hrs     Status: None   Collection Time: 07/20/19  1:04 PM   Specimen: Nasopharyngeal Swab; Respiratory  Result Value Ref Range Status   SARS-CoV-2, NAA NOT DETECTED NOT DETECTED Final    Comment: (NOTE) This nucleic  acid amplification test was developed and its performance characteristics determined by [facility]. Nucleic acid amplification tests include PCR and TMA. This test has not been FDA cleared or approved. This test has been authorized by FDA under an Emergency Use Authorization (EUA). This test is only authorized for the duration of time the declaration that circumstances exist justifying the authorization of the emergency use of in vitro diagnostic tests for detection of SARS-CoV-2 virus and/or diagnosis of COVID-19 infection under section 564(b)(1) of the Act, 21 U.S.C. 161WRU-0(A360bbb-3(b) (1), unless the authorization is terminated or revoked sooner. When diagnostic testing is negative, the possibility of a false negative result should be considered in the context of a patient's recent exposures and the presence of clinical signs and symptoms consistent with COVID-19. An individual without symptoms of COVID- 19 and who is not shedding SARS-CoV-2 vi rus would expect to have a negative (not detected) result in this assay. Performed At: [facility] [place] [address] [place], [place] [id number] Jolene SchimkeNagendra Sanjai MD YN:[id number]:[phone]    Coronavirus Source NASOPHARYNGEAL  Final    Comment: Performed at [facility], [address] [place], [place] [address]   Creatinine: No results for input(s): CREATININE in the last 168 hours.  Impression/Assessment:  Lower urinary tract symptoms, bilateral spermatoceles, microscopic hematuria-  Plan:  I discussed with the patient the nature, potential benefits, risks and alternatives to flexible cystoscopy, bilateral spermatocelectomy, including side effects of the proposed treatment, the likelihood of the patient achieving the goals of the procedure, and any potential problems that might occur during the procedure or recuperation. All questions answered. Patient elects to proceed.   Jerilee FieldMatthew Avangelina Flight 07/23/2019, 1:32 PM

## 2019-07-23 NOTE — Anesthesia Procedure Notes (Signed)
Procedure Name: LMA Insertion Date/Time: 07/23/2019 1:48 PM Performed by: Eben Burow, CRNA Pre-anesthesia Checklist: Patient identified, Emergency Drugs available, Suction available, Patient being monitored and Timeout performed Patient Re-evaluated:Patient Re-evaluated prior to induction Oxygen Delivery Method: Circle system utilized Preoxygenation: Pre-oxygenation with 100% oxygen Induction Type: IV induction Ventilation: Mask ventilation without difficulty LMA: LMA inserted LMA Size: 5.0 Tube secured with: Tape Dental Injury: Teeth and Oropharynx as per pre-operative assessment

## 2019-07-23 NOTE — Transfer of Care (Signed)
Immediate Anesthesia Transfer of Care Note  Patient: Bobby Osborne  Procedure(s) Performed: CYSTOSCOPY FLEXIBLE (N/A ) SPERMATOCELECTOMY (Bilateral )  Patient Location: PACU  Anesthesia Type:General  Level of Consciousness: awake, alert , oriented and patient cooperative  Airway & Oxygen Therapy: Patient Spontanous Breathing and Patient connected to face mask oxygen  Post-op Assessment: Report given to RN, Post -op Vital signs reviewed and stable and Patient moving all extremities X 4  Post vital signs: stable  Last Vitals:  Vitals Value Taken Time  BP 117/69 07/23/19 1545  Temp    Pulse 84 07/23/19 1551  Resp 16 07/23/19 1551  SpO2 99 % 07/23/19 1551  Vitals shown include unvalidated device data.  Last Pain:  Vitals:   07/23/19 1130  TempSrc: Oral      Patients Stated Pain Goal: 3 (32/54/98 2641)  Complications: No apparent anesthesia complications

## 2019-07-26 ENCOUNTER — Encounter (HOSPITAL_COMMUNITY): Payer: Self-pay | Admitting: Urology

## 2019-07-26 NOTE — Anesthesia Postprocedure Evaluation (Signed)
Anesthesia Post Note  Patient: Bobby Osborne  Procedure(s) Performed: CYSTOSCOPY FLEXIBLE (N/A ) SPERMATOCELECTOMY (Bilateral )     Patient location during evaluation: PACU Anesthesia Type: General Level of consciousness: awake Pain management: pain level controlled Vital Signs Assessment: post-procedure vital signs reviewed and stable Respiratory status: spontaneous breathing Cardiovascular status: stable Postop Assessment: no apparent nausea or vomiting Anesthetic complications: no    Last Vitals:  Vitals:   07/23/19 1545 07/23/19 1600  BP: 117/69 118/67  Pulse: 88 84  Resp: 14 11  Temp:  36.6 C  SpO2: 99% 95%    Last Pain:  Vitals:   07/23/19 1600  TempSrc:   PainSc: 3    Pain Goal: Patients Stated Pain Goal: 3 (07/23/19 1151)                 Huston Foley

## 2020-01-07 ENCOUNTER — Ambulatory Visit: Payer: Medicare Other | Attending: Internal Medicine

## 2020-01-07 DIAGNOSIS — Z23 Encounter for immunization: Secondary | ICD-10-CM

## 2020-01-07 NOTE — Progress Notes (Signed)
   Covid-19 Vaccination Clinic  Name:  Bobby Osborne    MRN: [id number] DOB: Apr 27, 1951  01/07/2020  Bobby Osborne was observed post Covid-19 immunization for 15 minutes without incidence. He was provided with Vaccine Information Sheet and instruction to access the V-Safe system.   Bobby Osborne was instructed to call 911 with any severe reactions post vaccine: Marland Kitchen Difficulty breathing  . Swelling of your face and throat  . A fast heartbeat  . A bad rash all over your body  . Dizziness and weakness    Immunizations Administered    Name Date Dose VIS Date Route   Pfizer COVID-19 Vaccine 01/07/2020  2:22 PM 0.3 mL 10/22/2019 Intramuscular   Manufacturer: [employer], [employer]   Lot: [id number]   NDC: [address]-5

## 2020-01-09 ENCOUNTER — Ambulatory Visit: Payer: Medicare Other

## 2020-02-02 ENCOUNTER — Ambulatory Visit: Payer: Medicare Other | Attending: Internal Medicine

## 2020-02-02 DIAGNOSIS — Z23 Encounter for immunization: Secondary | ICD-10-CM

## 2020-02-02 NOTE — Progress Notes (Signed)
   Covid-19 Vaccination Clinic  Name:  TYVION EDMONDSON    MRN: [id number] DOB: 04/25/1951  02/02/2020  Mr. Markie was observed post Covid-19 immunization for 15 minutes without incident. He was provided with Vaccine Information Sheet and instruction to access the V-Safe system.   Mr. Pullman was instructed to call 911 with any severe reactions post vaccine: Marland Kitchen Difficulty breathing  . Swelling of face and throat  . A fast heartbeat  . A bad rash all over body  . Dizziness and weakness   Immunizations Administered    Name Date Dose VIS Date Route   Pfizer COVID-19 Vaccine 02/02/2020  2:59 PM 0.3 mL 10/22/2019 Intramuscular   Manufacturer: [employer], [employer]   Lot: [id number]   NDC: [address]-0

## 2020-02-29 NOTE — Progress Notes (Signed)
Patient referred by Berkley Harvey, NP for Hyperlipidemia, aorta calcification    Subjective:   Bobby Osborne, male    DOB: 12/29/1950, 69 y.o.   MRN: [id number]   Chief Complaint  Patient presents with  . Mixed Hyperlipidemia  . aorta calcification  . New Patient (Initial Visit)     HPI  69 y.o. Caucasian male with hyperlipidemia, type 2 DM, incidental finding of aorta calcification.  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 He underwent two cycles of cytoxan therapy. Eventually, it was found to be linked to celiac disease, and resolved by itself after changing to gluten free diet. He had been on statins before, but does not recall any severe side effects. Rather, it was changed/stopped due to lack of efficacy. He is currently on Zetia 10 mg daily with minimal improvement in his his lipids.   He is very active, walks 6 1/2 miles several times a week. He has noticed occasional exertional dyspnea, while walking up the hill. He denies any chest pain.   He is not on any treatment for his borderline DM. He doe endorse eating carb heavy diet.   He has h/o colon polyps and family h/o colon cancer. He gets colonoscopy every 3 years.   Past Medical History:  Diagnosis Date  . Celiac disease   . Chronic kidney disease   . Dyspnea on exertion 04/2019   PCP SENT FOR CARDIAC EVAL, UNDERWENT ECHO TTE WHICH WAS UNREMARKABLE, PATIENT REPORTS IMPROVEMENT NOW SINCE CARDIAC EVAL .    Marland Kitchen History of chemotherapy    FOR TREATMENT OF NEPRHOTIC SYNDROME   . Hypercholesteremia   . Nephrotic syndrome    DX AGE 89      Past Surgical History:  Procedure Laterality Date  . COLONOSCOPY  2019  . CYSTOSCOPY N/A 07/23/2019   Procedure: Erlene Quan;  Surgeon: Festus Aloe, MD;  Location: [facility];  Service: Urology;  Laterality: N/A;  NEEDS 90 MIN FOR ALL  . EYE SURGERY    .  SPERMATOCELECTOMY Bilateral 07/23/2019   Procedure: SPERMATOCELECTOMY;  Surgeon: Festus Aloe, MD;  Location: [facility];  Service: Urology;  Laterality: Bilateral;     Social History   Tobacco Use  Smoking Status Never Smoker  Smokeless Tobacco Never Used    Social History   Substance and Sexual Activity  Alcohol Use Never  . Alcohol/week: 0.0 standard drinks     Family History  Problem Relation Age of Onset  . Mental illness Mother      Current Outpatient Medications on File Prior to Visit  Medication Sig Dispense Refill  . Calcium Carb-Cholecalciferol (CALCIUM 600 + D PO) Take 1 tablet by mouth daily.    . Carboxymethylcellul-Glycerin (REFRESH RELIEVA PF OP) Place 1 drop into both eyes 2 (two) times a week.    . ezetimibe (ZETIA) 10 MG tablet Take 10 mg by mouth daily.    . Multiple Vitamin (MULTIVITAMIN WITH MINERALS) TABS tablet Take 1 tablet by mouth daily.    . Multiple Vitamins-Minerals (PRESERVISION AREDS 2 PO) Take 1 tablet by mouth daily.    Marland Kitchen OVER THE COUNTER MEDICATION Take 1 capsule by mouth daily. Hydro Eyes    . oxybutynin (DITROPAN) 5 MG tablet Take 5 mg by mouth daily.     . tamsulosin (FLOMAX) 0.4 MG CAPS capsule Take 0.4 mg by mouth daily. hasnt started yet     No  current facility-administered medications on file prior to visit.    Cardiovascular and other pertinent studies:  EKG 03/01/2020: Sinus rhythm 68 bpm. Normal EKG.   Recent labs: 02/03/2020: Glucose 96, BUN/Cr 15/0.81. EGFR >90. Na/K 138/4.0. H/H 14.9/43.9. MCV 91.9. Platelets 204 HbA1C 6.6% Chol 221, TG 104, HDL 52, LDL 148 .  Review of Systems  Cardiovascular: Positive for dyspnea on exertion. Negative for chest pain, leg swelling, palpitations and syncope.         Vitals:   03/01/20 0959  BP: 124/75  Pulse: 76  Temp: 97.6 F (36.4 C)  SpO2: 98%     Body mass index is 22.65 kg/m. Filed Weights   03/01/20 0959  Weight: 167 lb (75.8 kg)     Objective:    Physical Exam  Constitutional: He appears well-developed and well-nourished.  Neck: No JVD present.  Cardiovascular: Normal rate, regular rhythm, normal heart sounds and intact distal pulses.  No murmur heard. Pulmonary/Chest: Effort normal and breath sounds normal. He has no wheezes. He has no rales.  Musculoskeletal:        General: No edema.  Nursing note and vitals reviewed.         Assessment & Recommendations:   69 y.o. Caucasian male with hyperlipidemia, type 2 DM, incidental finding of aorta calcification.  Aorta calcification, hyperlipidemia: 10 yr ASCVD risk 28.9%. Currently on Zetia 10 mg. Recommend adding high intensity statin. Added Crestor1 0 mg daily. Discussed possibility of increase in blood glucose. I have counseled him re: reducing carbohydrate intake. I encouraged him to discuss with his PCP re: using metformin. Repeat lipid panel in 3 months. Given his h/o colon polyps, I do not recommend Aspirin at this time,   Exertional dyspnea: In light of his risk factors-including hypertension, DM, aorta calcification, recommend exercise nuclear stress test.  F/u in 3 months   Thank you for referring the patient to [place]. Please feel free to contact with any questions.  Nigel Mormon, MD [facility] Pager: [phone] Office: [phone]

## 2020-03-01 ENCOUNTER — Ambulatory Visit: Payer: Medicare Other | Admitting: Cardiology

## 2020-03-01 ENCOUNTER — Encounter: Payer: Self-pay | Admitting: Cardiology

## 2020-03-01 ENCOUNTER — Other Ambulatory Visit: Payer: Self-pay

## 2020-03-01 VITALS — BP 124/75 | HR 76 | Temp 97.6°F | Ht 72.0 in | Wt 167.0 lb

## 2020-03-01 DIAGNOSIS — I700 Atherosclerosis of aorta: Secondary | ICD-10-CM | POA: Insufficient documentation

## 2020-03-01 DIAGNOSIS — E782 Mixed hyperlipidemia: Secondary | ICD-10-CM | POA: Insufficient documentation

## 2020-03-01 DIAGNOSIS — R7303 Prediabetes: Secondary | ICD-10-CM

## 2020-03-01 DIAGNOSIS — R0609 Other forms of dyspnea: Secondary | ICD-10-CM

## 2020-03-01 MED ORDER — ROSUVASTATIN CALCIUM 10 MG PO TABS: 10.0000 mg | ORAL_TABLET | Freq: Every day | ORAL | 3 refills | Status: DC

## 2020-03-02 ENCOUNTER — Encounter: Payer: Self-pay | Admitting: Cardiology

## 2020-03-02 DIAGNOSIS — R0609 Other forms of dyspnea: Secondary | ICD-10-CM | POA: Insufficient documentation

## 2020-03-02 DIAGNOSIS — R7303 Prediabetes: Secondary | ICD-10-CM | POA: Insufficient documentation

## 2020-03-10 ENCOUNTER — Other Ambulatory Visit (HOSPITAL_COMMUNITY): Payer: Self-pay

## 2020-03-11 ENCOUNTER — Other Ambulatory Visit (HOSPITAL_COMMUNITY): Admission: RE | Admit: 2020-03-11 | Payer: Medicare Other | Source: Ambulatory Visit

## 2020-03-13 ENCOUNTER — Other Ambulatory Visit (HOSPITAL_COMMUNITY)
Admission: RE | Admit: 2020-03-13 | Discharge: 2020-03-13 | Disposition: A | Discharge status: Home / Self Care | Payer: Medicare Other | Source: Ambulatory Visit | Attending: Cardiology | Admitting: Cardiology

## 2020-03-13 DIAGNOSIS — Z01812 Encounter for preprocedural laboratory examination: Secondary | ICD-10-CM | POA: Diagnosis present

## 2020-03-13 DIAGNOSIS — Z20822 Contact with and (suspected) exposure to covid-19: Secondary | ICD-10-CM | POA: Diagnosis not present

## 2020-03-13 LAB — SARS CORONAVIRUS 2 (TAT 6-24 HRS): SARS Coronavirus 2: NEGATIVE

## 2020-03-15 ENCOUNTER — Ambulatory Visit: Payer: Medicare Other

## 2020-03-15 ENCOUNTER — Other Ambulatory Visit: Payer: Self-pay

## 2020-03-19 NOTE — Progress Notes (Signed)
Normal stress test

## 2020-05-30 LAB — LIPID PANEL
Chol/HDL Ratio: 2.7 ratio (ref 0.0–5.0)
Cholesterol, Total: 129 mg/dL (ref 100–199)
HDL: 48 mg/dL (ref >39)
LDL Chol Calc (NIH): 67 mg/dL (ref 0–99)
Triglycerides: 67 mg/dL (ref 0–149)
VLDL Cholesterol Cal: 14 mg/dL (ref 5–40)

## 2020-05-30 LAB — HEMOGLOBIN A1C
Est. average glucose Bld gHb Est-mCnc: 114 mg/dL
Hgb A1c MFr Bld: 5.6 % (ref 4.8–5.6)

## 2020-06-02 ENCOUNTER — Ambulatory Visit: Payer: Medicare Other | Admitting: Cardiology

## 2020-06-02 ENCOUNTER — Other Ambulatory Visit: Payer: Self-pay

## 2020-06-02 ENCOUNTER — Encounter: Payer: Self-pay | Admitting: Cardiology

## 2020-06-02 VITALS — BP 134/83 | HR 59 | Ht 72.0 in | Wt 163.0 lb

## 2020-06-02 DIAGNOSIS — R0609 Other forms of dyspnea: Secondary | ICD-10-CM

## 2020-06-02 DIAGNOSIS — R7303 Prediabetes: Secondary | ICD-10-CM

## 2020-06-02 DIAGNOSIS — E782 Mixed hyperlipidemia: Secondary | ICD-10-CM

## 2020-06-02 DIAGNOSIS — I700 Atherosclerosis of aorta: Secondary | ICD-10-CM

## 2020-06-02 HISTORY — PX: MOLE REMOVAL: SHX2046

## 2020-06-02 MED ORDER — ROSUVASTATIN CALCIUM 10 MG PO TABS: 10.0000 mg | ORAL_TABLET | Freq: Every day | ORAL | 3 refills | Status: DC

## 2020-06-02 NOTE — Progress Notes (Signed)
Patient referred by Berkley Harvey, NP for Hyperlipidemia, aorta calcification    Subjective:   Bobby Osborne, male    DOB: [birth year], 69 y.o.   MRN: [id number]   Chief Complaint  Patient presents with   Hyperlipidemia    HPI  69 y.o. Caucasian male with hyperlipidemia, incidental finding of aorta calcification.  Exercise nuclear stress test showed excellent exercise capacity (10 METS), with no ischemia. Recent lipid panel reviewed with the patient.  Since his last visit, he has made dramatic changes to his diet.  He is cut out sugar from his diet almost completely.  As result, his A1c has dropped to 5.6%.  LDL is also improved to 67.  He continues to walk 6 miles a day.  The only time he is shortness of breath is when he is walking up the hill and talking on the phone at the same time.  Otherwise, denies any chest pain or shortness of breath symptoms.   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 He underwent two cycles of cytoxan therapy. Eventually, it was found to be linked to celiac disease, and resolved by itself after changing to gluten free diet. He had been on statins before, but does not recall any severe side effects. Rather, it was changed/stopped due to lack of efficacy. He is currently on Zetia 10 mg daily with minimal improvement in his his lipids.   He is very active, walks 6 1/2 miles several times a week. He has noticed occasional exertional dyspnea, while walking up the hill. He denies any chest pain.   He is not on any treatment for his borderline DM. He doe endorse eating carb heavy diet.   He has h/o colon polyps and family h/o colon cancer. He gets colonoscopy every 3 years.    Current Outpatient Medications on File Prior to Visit  Medication Sig Dispense Refill   Calcium Carb-Cholecalciferol (CALCIUM 600 + D PO)  Take 1 tablet by mouth daily.     Carboxymethylcellul-Glycerin (REFRESH RELIEVA PF OP) Place 1 drop into both eyes 2 (two) times a week.     ezetimibe (ZETIA) 10 MG tablet Take 10 mg by mouth daily.     Multiple Vitamin (MULTIVITAMIN WITH MINERALS) TABS tablet Take 1 tablet by mouth daily.     Multiple Vitamins-Minerals (PRESERVISION AREDS 2 PO) Take 1 tablet by mouth daily.     OVER THE COUNTER MEDICATION Take 1 capsule by mouth daily. Hydro Eyes     oxybutynin (DITROPAN) 5 MG tablet Take 5 mg by mouth daily.      rosuvastatin (CRESTOR) 10 MG tablet Take 1 tablet (10 mg total) by mouth daily. 30 tablet 3   tamsulosin (FLOMAX) 0.4 MG CAPS capsule Take 0.4 mg by mouth daily. hasnt started yet     No current facility-administered medications on file prior to visit.    Cardiovascular and other pertinent studies:  Exercise tetrofosmin stress test  03/15/2020: Normal ECG stress. The patient exercised for 8:00 minutes of a Bruce protocol, achieving approximately 10.14 METs. Overall LV systolic function is normal without regional wall motion abnormalities. Myocardial perfusion is normal. Stress LV EF: 58%.  No previous exam available for comparison. Low risk.    EKG 03/01/2020: Sinus rhythm 68 bpm. Normal EKG.   Recent labs: 05/19/2020: Chol 129, TG 67, HDL 48, LDL 67 HbA1C 6.6%  02/03/2020: Glucose 96,  BUN/Cr 15/0.81. EGFR >90. Na/K 138/4.0. H/H 14.9/43.9. MCV 91.9. Platelets 204 HbA1C 6.6% Chol 221, TG 104, HDL 52, LDL 148 .  Review of Systems  Cardiovascular: Positive for dyspnea on exertion. Negative for chest pain, leg swelling, palpitations and syncope.         Vitals:   06/02/20 1432  BP: (!) 134/83  Pulse: 59  SpO2: 96%     Body mass index is 22.11 kg/m. Filed Weights   06/02/20 1432  Weight: 163 lb (73.9 kg)     Objective:   Physical Exam Vitals and nursing note reviewed.  Constitutional:      Appearance: He is well-developed.  Neck:      Vascular: No JVD.  Cardiovascular:     Rate and Rhythm: Normal rate and regular rhythm.     Pulses: Intact distal pulses.     Heart sounds: Normal heart sounds. No murmur heard.   Pulmonary:     Effort: Pulmonary effort is normal.     Breath sounds: Normal breath sounds. No wheezing or rales.           Assessment & Recommendations:   69 y.o. Caucasian male with hyperlipidemia, incidental finding of aorta calcification.  Aorta calcification, hyperlipidemia: Exercise nuclear stress test with no ischemia. 10 METS (02/2020) Not on Aspirin due to h/o colon polyps. LDL down from 148  To 67 on Crestor 10 mg.  A1c down to 5.6%.  Congratulated on his diet changes.  Repeat lipid panel and follow-up in 1 year.  Nigel Mormon, MD [facility] Pager: [phone] Office: [phone]

## 2020-06-09 ENCOUNTER — Ambulatory Visit: Payer: 59 | Admitting: Cardiology

## 2021-06-01 ENCOUNTER — Ambulatory Visit: Payer: Medicare Other | Admitting: Cardiology

## 2021-06-01 ENCOUNTER — Encounter: Payer: Self-pay | Admitting: Cardiology

## 2021-06-01 ENCOUNTER — Other Ambulatory Visit: Payer: Self-pay

## 2021-06-01 VITALS — BP 140/81 | HR 41 | Temp 98.0°F | Resp 16 | Ht 72.0 in | Wt 169.0 lb

## 2021-06-01 DIAGNOSIS — R0600 Dyspnea, unspecified: Secondary | ICD-10-CM

## 2021-06-01 DIAGNOSIS — E782 Mixed hyperlipidemia: Secondary | ICD-10-CM

## 2021-06-01 DIAGNOSIS — I700 Atherosclerosis of aorta: Secondary | ICD-10-CM

## 2021-06-01 DIAGNOSIS — R0609 Other forms of dyspnea: Secondary | ICD-10-CM

## 2021-06-01 NOTE — Progress Notes (Signed)
Patient referred by Berkley Harvey, NP for Hyperlipidemia, aorta calcification    Subjective:   Bobby Osborne, male    DOB: Nov 05, 1951, 70 y.o.   MRN: [id number]   Chief Complaint  Patient presents with   Hyperlipidemia   Exertional dyspnea   Follow-up    1 year     HPI  70 y.o. Caucasian male with hyperlipidemia, incidental finding of aorta calcification.  Patient is doing well.  He denies chest pain, shortness of breath, palpitations, leg edema, orthopnea, PND, TIA/syncope.  He is compliant with his medical therapy he joined a gym they exercises at 30 minutes of treadmill at 30 min after elliptical.  Reviewed recent lab results with the patient, details below.    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 He underwent two cycles of cytoxan therapy. Eventually, it was found to be linked to celiac disease, and resolved by itself after changing to gluten free diet. He had been on statins before, but does not recall any severe side effects. Rather, it was changed/stopped due to lack of efficacy. He is currently on Zetia 10 mg daily with minimal improvement in his his lipids.   He is very active, walks 6 1/2 miles several times a week. He has noticed occasional exertional dyspnea, while walking up the hill. He denies any chest pain.   He is not on any treatment for his borderline DM. He doe endorse eating carb heavy diet.   He has h/o colon polyps and family h/o colon cancer. He gets colonoscopy every 3 years.    Current Outpatient Medications on File Prior to Visit  Medication Sig Dispense Refill   Calcium Carb-Cholecalciferol (CALCIUM 600 + D PO) Take 1 tablet by mouth daily.     Carboxymethylcellul-Glycerin (REFRESH RELIEVA PF OP) Place 1 drop into both eyes 2 (two) times a week.     ezetimibe (ZETIA) 10 MG tablet Take 10 mg by mouth  daily.     Multiple Vitamin (MULTIVITAMIN WITH MINERALS) TABS tablet Take 1 tablet by mouth daily.     Multiple Vitamins-Minerals (PRESERVISION AREDS 2 PO) Take 1 tablet by mouth daily.     OVER THE COUNTER MEDICATION Take 1 capsule by mouth daily. Hydro Eyes     oxybutynin (DITROPAN) 5 MG tablet Take 5 mg by mouth daily.      rosuvastatin (CRESTOR) 10 MG tablet Take 1 tablet (10 mg total) by mouth daily. 90 tablet 3   tamsulosin (FLOMAX) 0.4 MG CAPS capsule Take 0.4 mg by mouth daily. hasnt started yet (Patient not taking: Reported on 06/02/2020)     No current facility-administered medications on file prior to visit.    Cardiovascular and other pertinent studies:  EKG 06/01/2021: Sinus rhythm 69 bpm Normal EKG  Exercise tetrofosmin stress test  03/15/2020: Normal ECG stress. The patient exercised for 8:00 minutes of a Bruce protocol, achieving approximately 10.14 METs. Overall LV systolic function is normal without regional wall motion abnormalities. Myocardial perfusion is normal. Stress LV EF: 58%.  No previous exam available for comparison. Low risk.    Recent labs: 05/24/2021: Glucose 118, BUN/Cr 15/1.02. EGFR 79. Na/K 140/4.6. Rest of the CMP normal H/H 15/44. MCV 92. Platelets 220 HbA1C 5.8% Chol 135, TG 74, HDL 55, LDL 65 TSH 1.6 normal  05/19/2020: Chol 129, TG 67, HDL 48, LDL 67 HbA1C 6.6%  02/03/2020: Glucose 96, BUN/Cr  15/0.81. EGFR >90. Na/K 138/4.0. H/H 14.9/43.9. MCV 91.9. Platelets 204 HbA1C 6.6% Chol 221, TG 104, HDL 52, LDL 148 .  Review of Systems  Cardiovascular:  Negative for chest pain, dyspnea on exertion, leg swelling, palpitations and syncope.        Vitals:   06/01/21 1341  BP: 140/81  Pulse: (!) 41  Resp: 16  Temp: 98 F (36.7 C)  SpO2: 95%      Body mass index is 22.92 kg/m. Filed Weights   06/01/21 1341  Weight: 169 lb (76.7 kg)     Objective:   Physical Exam Vitals and nursing note reviewed.  Constitutional:       General: He is not in acute distress. Neck:     Vascular: No JVD.  Cardiovascular:     Rate and Rhythm: Normal rate and regular rhythm.     Pulses: Normal pulses.     Heart sounds: Normal heart sounds. No murmur heard. Pulmonary:     Effort: Pulmonary effort is normal.     Breath sounds: Normal breath sounds. No wheezing or rales.  Musculoskeletal:     Right lower leg: No edema.     Left lower leg: No edema.          Assessment & Recommendations:   70 y.o. Caucasian male with hyperlipidemia, incidental finding of aorta calcification.  Aorta calcification, hyperlipidemia: Exercise nuclear stress test with no ischemia. 10 METS (02/2020) Not on Aspirin due to h/o colon polyps. Lipids well controlled on Crestor 10 mg.  Follow-up as needed  Nigel Mormon, MD [facility] Pager: [phone] Office: [phone]

## 2021-07-04 ENCOUNTER — Other Ambulatory Visit: Payer: Self-pay | Admitting: Cardiology

## 2021-07-04 DIAGNOSIS — I700 Atherosclerosis of aorta: Secondary | ICD-10-CM

## 2021-07-04 DIAGNOSIS — E782 Mixed hyperlipidemia: Secondary | ICD-10-CM

## 2022-08-19 ENCOUNTER — Other Ambulatory Visit: Payer: Self-pay | Admitting: Cardiology

## 2022-08-19 DIAGNOSIS — E782 Mixed hyperlipidemia: Secondary | ICD-10-CM

## 2022-08-19 DIAGNOSIS — I700 Atherosclerosis of aorta: Secondary | ICD-10-CM

## 2022-08-31 ENCOUNTER — Other Ambulatory Visit: Payer: Self-pay | Admitting: Cardiology

## 2022-08-31 DIAGNOSIS — E782 Mixed hyperlipidemia: Secondary | ICD-10-CM

## 2022-08-31 DIAGNOSIS — I700 Atherosclerosis of aorta: Secondary | ICD-10-CM

## 2022-11-06 ENCOUNTER — Encounter (HOSPITAL_COMMUNITY): Payer: Self-pay

## 2022-11-06 ENCOUNTER — Emergency Department (HOSPITAL_COMMUNITY)
Admission: EM | Admit: 2022-11-06 | Discharge: 2022-11-07 | Disposition: A | Discharge status: Home / Self Care | Payer: Medicare Other | Attending: Emergency Medicine | Admitting: Emergency Medicine

## 2022-11-06 ENCOUNTER — Emergency Department (HOSPITAL_COMMUNITY): Payer: Medicare Other

## 2022-11-06 ENCOUNTER — Other Ambulatory Visit: Payer: Self-pay

## 2022-11-06 DIAGNOSIS — R911 Solitary pulmonary nodule: Secondary | ICD-10-CM | POA: Diagnosis not present

## 2022-11-06 DIAGNOSIS — R5383 Other fatigue: Secondary | ICD-10-CM | POA: Diagnosis present

## 2022-11-06 DIAGNOSIS — U071 COVID-19: Secondary | ICD-10-CM | POA: Insufficient documentation

## 2022-11-06 DIAGNOSIS — Z79899 Other long term (current) drug therapy: Secondary | ICD-10-CM | POA: Diagnosis not present

## 2022-11-06 DIAGNOSIS — R0781 Pleurodynia: Secondary | ICD-10-CM

## 2022-11-06 DIAGNOSIS — N189 Chronic kidney disease, unspecified: Secondary | ICD-10-CM | POA: Insufficient documentation

## 2022-11-06 DIAGNOSIS — Z9189 Other specified personal risk factors, not elsewhere classified: Secondary | ICD-10-CM

## 2022-11-06 LAB — BASIC METABOLIC PANEL
Anion gap: 7 (ref 5–15)
BUN: 11 mg/dL (ref 8–23)
CO2: 25 mmol/L (ref 22–32)
Calcium: 9.2 mg/dL (ref 8.9–10.3)
Chloride: 105 mmol/L (ref 98–111)
Creatinine, Ser: 0.92 mg/dL (ref 0.61–1.24)
GFR, Estimated: >60 mL/min (ref >60)
Glucose, Bld: 93 mg/dL (ref 70–99)
Potassium: 4.2 mmol/L (ref 3.5–5.1)
Sodium: 137 mmol/L (ref 135–145)

## 2022-11-06 LAB — CBC
HCT: 43 % (ref 39.0–52.0)
Hemoglobin: 15 g/dL (ref 13.0–17.0)
MCH: 32.1 pg (ref 26.0–34.0)
MCHC: 34.9 g/dL (ref 30.0–36.0)
MCV: 92.1 fL (ref 80.0–100.0)
Platelets: 186 10*3/uL (ref 150–400)
RBC: 4.67 MIL/uL (ref 4.22–5.81)
RDW: 12.8 % (ref 11.5–15.5)
WBC: 4.6 10*3/uL (ref 4.0–10.5)
nRBC: 0 % (ref 0.0–0.2)

## 2022-11-06 LAB — TROPONIN I (HIGH SENSITIVITY): Troponin I (High Sensitivity): 6 ng/L (ref <18)

## 2022-11-06 NOTE — ED Provider Triage Note (Signed)
Emergency Medicine Provider Triage Evaluation Note  Bobby Osborne , a 71 y.o. male  was evaluated in triage.  Pt complains of right sided chest pain this morning, has gotten worse. Pain with deep breathing. Tested positive for COVID the other day, has had symptoms 12/25. No hx of PE or DVT.  Review of Systems  Positive: CP, cough Negative: SOB, fever, leg pain or swelling  Physical Exam  BP 134/77 (BP Location: Right Arm)   Pulse 86   Temp 98.2 F (36.8 C)   Resp 16   Ht 6' (1.829 m)   Wt 79.4 kg   SpO2 100%   BMI 23.73 kg/m  Gen:   Awake, no distress   Resp:  Normal effort  MSK:   Moves extremities without difficulty  Other:    Medical Decision Making  Medically screening exam initiated at 2:29 PM.  Appropriate orders placed.  RAMSES KLECKA was informed that the remainder of the evaluation will be completed by another provider, this initial triage assessment does not replace that evaluation, and the importance of remaining in the ED until their evaluation is complete.  Workup initiated   Su Monks, PA-C 11/06/22 1432

## 2022-11-06 NOTE — ED Triage Notes (Addendum)
Pt c/o non productive cough and congestion, generalized weakness since Monday; also c/o R sided CP, worse with inspiration; denies N/V, endorses some dizziness; took home test today, tested positive for Covid

## 2022-11-07 ENCOUNTER — Emergency Department (HOSPITAL_COMMUNITY): Payer: Medicare Other

## 2022-11-07 DIAGNOSIS — U071 COVID-19: Secondary | ICD-10-CM | POA: Diagnosis not present

## 2022-11-07 LAB — TROPONIN I (HIGH SENSITIVITY): Troponin I (High Sensitivity): 6 ng/L (ref <18)

## 2022-11-07 MED ORDER — SODIUM CHLORIDE 0.9 % IV BOLUS
500.0000 mL | Freq: Once | INTRAVENOUS | Status: AC
  Administered 2022-11-07: 500 mL via INTRAVENOUS

## 2022-11-07 MED ORDER — IOHEXOL 350 MG/ML SOLN
75.0000 mL | Freq: Once | INTRAVENOUS | Status: AC | PRN
  Administered 2022-11-07: 75 mL via INTRAVENOUS

## 2022-11-07 MED ORDER — NIRMATRELVIR/RITONAVIR (PAXLOVID)TABLET: 3.0000 | ORAL_TABLET | Freq: Two times a day (BID) | ORAL | 0 refills | Status: AC

## 2022-11-07 NOTE — ED Provider Notes (Signed)
[facility] EMERGENCY DEPARTMENT Provider Note   CSN: [id number] Arrival date & time: 11/06/22  1209     History  Chief Complaint  Patient presents with   Fatigue    Bobby Osborne is a 71 y.o. male.  71 year old male with past medical history of chronic kidney disease, celiac disease, nephrotic syndrome, hyperlipidemia presents with concern for right sided chest pain worse with deep inspiration.  Reports onset of symptoms yesterday, tested positive for COVID on home test.  Denies shortness of breath.  Reports generalized weakness and fatigue.       Home Medications Prior to Admission medications   Medication Sig Start Date End Date Taking? Authorizing Provider  nirmatrelvir/ritonavir (PAXLOVID) 20 x 150 MG & 10 x 100MG  TABS Take 3 tablets by mouth 2 (two) times daily for 5 days. Patient GFR is 60. Take nirmatrelvir (150 mg) two tablets twice daily for 5 days and ritonavir (100 mg) one tablet twice daily for 5 days. 11/07/22 11/12/22 Yes Tacy Learn, PA-C  Calcium Carb-Cholecalciferol (CALCIUM 600 + D PO) Take 1 tablet by mouth daily.    [provider]  Carboxymethylcellul-Glycerin (REFRESH RELIEVA PF OP) Place 1 drop into both eyes 2 (two) times a week.    [provider]  ezetimibe (ZETIA) 10 MG tablet Take 10 mg by mouth daily. 09/14/18   [provider]  Multiple Vitamin (MULTIVITAMIN WITH MINERALS) TABS tablet Take 1 tablet by mouth daily.    [provider]  Multiple Vitamins-Minerals (PRESERVISION AREDS 2 PO) Take 1 tablet by mouth daily.    [provider]  OVER THE COUNTER MEDICATION Take 1 capsule by mouth daily. Hydro Eyes    [provider]  oxybutynin (DITROPAN) 5 MG tablet Take 5 mg by mouth daily.     [provider]  rosuvastatin (CRESTOR) 10 MG tablet TAKE 1 TABLET BY MOUTH EVERY DAY 07/05/21   Patwardhan, Reynold Bowen, MD      Allergies    Pravastatin and Lipitor [atorvastatin]     Review of Systems   Review of Systems Negative except as per HPI Physical Exam Updated Vital Signs BP 135/77   Pulse 66   Temp 98.2 F (36.8 C)   Resp 18   Ht 6' (1.829 m)   Wt 79.4 kg   SpO2 99%   BMI 23.73 kg/m  Physical Exam Vitals and nursing note reviewed.  Constitutional:      General: He is not in acute distress.    Appearance: He is well-developed. He is not diaphoretic.  HENT:     Head: Normocephalic and atraumatic.  Cardiovascular:     Rate and Rhythm: Normal rate and regular rhythm.     Heart sounds: Normal heart sounds.  Pulmonary:     Effort: Pulmonary effort is normal.     Breath sounds: Normal breath sounds.  Chest:     Chest wall: No tenderness.  Abdominal:     Palpations: Abdomen is soft.     Tenderness: There is no abdominal tenderness.  Musculoskeletal:     Cervical back: Neck supple.     Right lower leg: No edema.     Left lower leg: No edema.  Lymphadenopathy:     Cervical: No cervical adenopathy.  Skin:    General: Skin is warm and dry.  Neurological:     Mental Status: He is alert and oriented to person, place, and time.  Psychiatric:        Behavior:  Behavior normal.     ED Results / Procedures / Treatments   Labs (all labs ordered are listed, but only abnormal results are displayed) Labs Reviewed  BASIC METABOLIC PANEL  CBC  TROPONIN I (HIGH SENSITIVITY)  TROPONIN I (HIGH SENSITIVITY)    EKG EKG Interpretation  Date/Time:  Wednesday November 06 2022 14:37:27 EST Ventricular Rate:  83 PR Interval:  148 QRS Duration: 78 QT Interval:  358 QTC Calculation: 420 R Axis:   -9 Text Interpretation: Normal sinus rhythm Normal ECG When compared with ECG of 06-Nov-2022 14:26, PREVIOUS ECG IS PRESENT Confirmed by Virgina Norfolk ([id number]) on 11/07/2022 7:32:07 AM  Radiology CT Angio Chest PE W/Cm &/Or Wo Cm  Result Date: 11/07/2022 CLINICAL DATA:  Nonproductive cough and congestion with right-sided pleuritic chest pain, suspicious  for pulmonary embolism EXAM: CT ANGIOGRAPHY CHEST WITH CONTRAST TECHNIQUE: Multidetector CT imaging of the chest was performed using the standard protocol during bolus administration of intravenous contrast. Multiplanar CT image reconstructions and MIPs were obtained to evaluate the vascular anatomy. RADIATION DOSE REDUCTION: This exam was performed according to the departmental dose-optimization program which includes automated exposure control, adjustment of the mA and/or kV according to patient size and/or use of iterative reconstruction technique. CONTRAST:  61mL OMNIPAQUE IOHEXOL 350 MG/ML SOLN COMPARISON:  Chest radiograph dated 11/05/2022, CT abdomen and pelvis dated 06/08/2019 FINDINGS: Cardiovascular: The study is high quality for the evaluation of pulmonary embolism. There are no filling defects in the central, lobar, segmental or subsegmental pulmonary artery branches to suggest acute pulmonary embolism. Anatomic variant common origin of the brachiocephalic and common carotid arteries. Normal heart size. No significant pericardial fluid/thickening. Coronary artery calcifications and aortic atherosclerosis. Mediastinum/Nodes: Partially imaged thyroid gland without nodules meeting criteria for imaging follow-up by size. Normal esophagus. No pathologically enlarged axillary, supraclavicular, mediastinal, or hilar lymph nodes. Lungs/Pleura: The central airways are patent. No focal consolidation. Calcified right upper lobe granuloma. 4 mm subpleural left upper lobe nodule (7:31). 2 mm right lower lobe nodule (7:65). No pneumothorax. No pleural effusion. Upper abdomen: Segment 2 hepatic cyst is not substantially changed measuring 2.2 cm (5:92). 2.4 cm segment 5 hepatic cyst is increased in size from 1.9 cm. Musculoskeletal: No acute or abnormal lytic or blastic osseous lesions. Review of the MIP images confirms the above findings. IMPRESSION: 1. No acute pulmonary embolism. 2. No focal consolidation. 3.  Bilateral pulmonary nodules measuring up to 4 mm. No follow-up needed if patient is low-risk (and has no known or suspected primary neoplasm). Non-contrast chest CT can be considered in 12 months if patient is high-risk. This recommendation follows the consensus statement: Guidelines for Management of Incidental Pulmonary Nodules Detected on CT Images: From the Fleischner Society 2017; Radiology 2017; 284:228-243. 4. Coronary artery calcifications. Aortic Atherosclerosis (ICD10-I70.0). Electronically Signed   By: Agustin Cree M.D.   On: 11/07/2022 09:08   DG Chest 2 View  Result Date: 11/06/2022 CLINICAL DATA:  Chest pain, cough EXAM: CHEST - 2 VIEW COMPARISON:  01/05/2020 FINDINGS: Biapical scarring. No acute confluent opacities or effusions. Heart is normal size. Mediastinal contours within normal limits. No acute bony abnormality. IMPRESSION: No active cardiopulmonary disease. Electronically Signed   By: Charlett Nose M.D.   On: 11/06/2022 15:21    Procedures Procedures    Medications Ordered in ED Medications  sodium chloride 0.9 % bolus 500 mL (0 mLs Intravenous Stopped 11/07/22 0933)  iohexol (OMNIPAQUE) 350 MG/ML injection 75 mL (75 mLs Intravenous Contrast Given 11/07/22 0857)    ED  Course/ Medical Decision Making/ A&P                           Medical Decision Making Amount and/or Complexity of Data Reviewed Radiology: ordered.  Risk Prescription drug management.   This patient presents to the ED for concern of right-sided chest pain, this involves an extensive number of treatment options, and is a complaint that carries with it a high risk of complications and morbidity.  The differential diagnosis includes but not limited to PE, viral illness, costochondritis, PNA   Co morbidities that complicate the patient evaluation  CKD, celiac disease, nephrotic syndrome, hyperlipidemia    Additional history obtained:  Additional history obtained from brother at bedside  External  records from outside source obtained and reviewed including recent labs on file   Lab Tests:  I Ordered, and personally interpreted labs.  The pertinent results include: CBC within normal meds, BMP normal limits, troponin unchanged at 6 and 6.   Imaging Studies ordered:  I ordered imaging studies including chest x-ray, CTA PE study I independently visualized and interpreted imaging which showed no acute cardiopulmonary disease on chest x-ray, PE study negative for PE, does have small pulmonary nodules I agree with the radiologist interpretation   Cardiac Monitoring: / EKG:  The patient was maintained on a cardiac monitor.  I personally viewed and interpreted the cardiac monitored which showed an underlying rhythm of: Sinus rhythm, rate 83  Problem List / ED Course / Critical interventions / Medication management  71 year old male presents from home, COVID-positive a few days ago, concern for right side pleuritic chest pain.  His cardiac workup is reassuring today including nonischemic EKG, unremarkable troponins, clear chest x-ray.  His vitals are reassuring including O2 sat 100% on room air.  Given pleuritic nature of his chest pain and recent COVID illness, CTA obtained to evaluate for PE and is negative for PE.  He does have incidental finding of small pulmonary nodules, he is a non-smoker.  He is advised to follow-up with his primary care provider for further discussion, verbalizes understanding.  Patient is discharged with prescription for Paxlovid with plan to recheck with PCP, return to ER for worsening or concerning symptoms. I have reviewed the patients home medicines and have made adjustments as needed   Social Determinants of Health:  Lives at home, has PCP for follow-up   Test / Admission - Considered:  After comprehensive evaluation emergency room, is felt stable for discharge home to recheck with PCP.         Final Clinical Impression(s) / ED Diagnoses Final  diagnoses:  COVID  Pleuritic chest pain  Pulmonary nodule less than 6 mm in diameter with low risk for malignant neoplasm    Rx / DC Orders ED Discharge Orders          Ordered    nirmatrelvir/ritonavir (PAXLOVID) 20 x 150 MG & 10 x 100MG  TABS  2 times daily        11/07/22 0920              Tacy Learn, PA-C 11/07/22 1519    Lennice Sites, DO 11/07/22 1543

## 2022-11-07 NOTE — Discharge Instructions (Signed)
Follow-up with your primary care provider for recheck and to discuss your CT results today. Return to the ER for worsening or concerning symptoms. Paxlovid as prescribed has been sent to your pharmacy.

## 2023-05-01 ENCOUNTER — Other Ambulatory Visit: Payer: Self-pay

## 2023-05-01 ENCOUNTER — Emergency Department (HOSPITAL_COMMUNITY)
Admission: EM | Admit: 2023-05-01 | Discharge: 2023-05-01 | Disposition: A | Discharge status: Home / Self Care | Payer: Medicare Other | Attending: Emergency Medicine | Admitting: Emergency Medicine

## 2023-05-01 ENCOUNTER — Encounter (HOSPITAL_COMMUNITY): Payer: Self-pay | Admitting: *Deleted

## 2023-05-01 ENCOUNTER — Emergency Department (HOSPITAL_COMMUNITY): Payer: Medicare Other

## 2023-05-01 DIAGNOSIS — H9190 Unspecified hearing loss, unspecified ear: Secondary | ICD-10-CM | POA: Insufficient documentation

## 2023-05-01 DIAGNOSIS — R42 Dizziness and giddiness: Secondary | ICD-10-CM | POA: Diagnosis not present

## 2023-05-01 DIAGNOSIS — R519 Headache, unspecified: Secondary | ICD-10-CM | POA: Diagnosis not present

## 2023-05-01 DIAGNOSIS — R791 Abnormal coagulation profile: Secondary | ICD-10-CM | POA: Diagnosis not present

## 2023-05-01 LAB — COMPREHENSIVE METABOLIC PANEL
ALT: 33 U/L (ref 0–44)
AST: 27 U/L (ref 15–41)
Albumin: 4.1 g/dL (ref 3.5–5.0)
Alkaline Phosphatase: 89 U/L (ref 38–126)
Anion gap: 9 (ref 5–15)
BUN: 11 mg/dL (ref 8–23)
CO2: 25 mmol/L (ref 22–32)
Calcium: 9.5 mg/dL (ref 8.9–10.3)
Chloride: 103 mmol/L (ref 98–111)
Creatinine, Ser: 0.99 mg/dL (ref 0.61–1.24)
GFR, Estimated: >60 mL/min (ref >60)
Glucose, Bld: 113 mg/dL — ABNORMAL HIGH (ref 70–99)
Potassium: 4.2 mmol/L (ref 3.5–5.1)
Sodium: 137 mmol/L (ref 135–145)
Total Bilirubin: 0.6 mg/dL (ref 0.3–1.2)
Total Protein: 6.8 g/dL (ref 6.5–8.1)

## 2023-05-01 LAB — URINALYSIS, ROUTINE W REFLEX MICROSCOPIC
Bilirubin Urine: NEGATIVE
Glucose, UA: NEGATIVE mg/dL
Hgb urine dipstick: NEGATIVE
Ketones, ur: NEGATIVE mg/dL
Leukocytes,Ua: NEGATIVE
Nitrite: NEGATIVE
Protein, ur: NEGATIVE mg/dL
Specific Gravity, Urine: 1.005 (ref 1.005–1.030)
pH: 6 (ref 5.0–8.0)

## 2023-05-01 LAB — DIFFERENTIAL
Abs Immature Granulocytes: 0.02 10*3/uL (ref 0.00–0.07)
Basophils Absolute: 0 10*3/uL (ref 0.0–0.1)
Basophils Relative: 1 %
Eosinophils Absolute: 0.3 10*3/uL (ref 0.0–0.5)
Eosinophils Relative: 5 %
Immature Granulocytes: 0 %
Lymphocytes Relative: 29 %
Lymphs Abs: 1.5 10*3/uL (ref 0.7–4.0)
Monocytes Absolute: 0.4 10*3/uL (ref 0.1–1.0)
Monocytes Relative: 8 %
Neutro Abs: 3 10*3/uL (ref 1.7–7.7)
Neutrophils Relative %: 57 %

## 2023-05-01 LAB — I-STAT CHEM 8, ED
BUN: 13 mg/dL (ref 8–23)
Calcium, Ion: 1.26 mmol/L (ref 1.15–1.40)
Chloride: 102 mmol/L (ref 98–111)
Creatinine, Ser: 0.9 mg/dL (ref 0.61–1.24)
Glucose, Bld: 111 mg/dL — ABNORMAL HIGH (ref 70–99)
HCT: 44 % (ref 39.0–52.0)
Hemoglobin: 15 g/dL (ref 13.0–17.0)
Potassium: 4.4 mmol/L (ref 3.5–5.1)
Sodium: 140 mmol/L (ref 135–145)
TCO2: 27 mmol/L (ref 22–32)

## 2023-05-01 LAB — CBG MONITORING, ED: Glucose-Capillary: 104 mg/dL — ABNORMAL HIGH (ref 70–99)

## 2023-05-01 LAB — CBC
HCT: 44.1 % (ref 39.0–52.0)
Hemoglobin: 15.4 g/dL (ref 13.0–17.0)
MCH: 31.2 pg (ref 26.0–34.0)
MCHC: 34.9 g/dL (ref 30.0–36.0)
MCV: 89.5 fL (ref 80.0–100.0)
Platelets: 210 10*3/uL (ref 150–400)
RBC: 4.93 MIL/uL (ref 4.22–5.81)
RDW: 12.6 % (ref 11.5–15.5)
WBC: 5.2 10*3/uL (ref 4.0–10.5)
nRBC: 0 % (ref 0.0–0.2)

## 2023-05-01 LAB — PROTIME-INR
INR: 1 (ref 0.8–1.2)
Prothrombin Time: 13.7 seconds (ref 11.4–15.2)

## 2023-05-01 LAB — APTT: aPTT: 29 seconds (ref 24–36)

## 2023-05-01 LAB — ETHANOL: Alcohol, Ethyl (B): <10 mg/dL (ref <10)

## 2023-05-01 MED ORDER — MECLIZINE HCL 25 MG PO TABS
25.0000 mg | ORAL_TABLET | Freq: Once | ORAL | Status: AC
  Administered 2023-05-01: 25 mg via ORAL
  Filled 2023-05-01: qty 1

## 2023-05-01 MED ORDER — SODIUM CHLORIDE 0.9% FLUSH
3.0000 mL | Freq: Once | INTRAVENOUS | Status: AC
  Administered 2023-05-01: 3 mL via INTRAVENOUS

## 2023-05-01 MED ORDER — LACTATED RINGERS IV BOLUS
1000.0000 mL | Freq: Once | INTRAVENOUS | Status: AC
  Administered 2023-05-01: 1000 mL via INTRAVENOUS

## 2023-05-01 MED ORDER — MECLIZINE HCL 25 MG PO TABS: 25.0000 mg | ORAL_TABLET | Freq: Three times a day (TID) | ORAL | 0 refills | Status: AC | PRN

## 2023-05-01 NOTE — ED Provider Notes (Signed)
[place] [facility] AT [facility] Provider Note   CSN: [id number] Arrival date & time: 05/01/23  1222     History  Chief Complaint  Patient presents with   light headedness    Bobby Osborne is a 72 y.o. male.  HPI 72 year old male presents with an abnormal left sided head feeling, hearing difficulty, and lightheadedness. Symptoms started at around 1100 am while in a meeting. Doesn't feel like pain but is an abnormal sensation. Feels like he might pass out. Sometimes gets worse with head movement. Has had 3 distinct episodes today, last being on arrival to ED. However right now is asymptomatic. No vision changes, difficulty walking or focal weakness/numbness.   Brother at bedside notes he had an adverse reaction to roller coaster ride in [place]. Patient indicates he had a more diffuse head sensation and nausea during the roller coaster, and wasn't unilateral at the time. However he does feel like that was similar.   Home Medications Prior to Admission medications   Medication Sig Start Date End Date Taking? Authorizing Provider  meclizine (ANTIVERT) 25 MG tablet Take 1 tablet (25 mg total) by mouth 3 (three) times daily as needed for dizziness. 05/01/23  Yes Pricilla Loveless, MD  Calcium Carb-Cholecalciferol (CALCIUM 600 + D PO) Take 1 tablet by mouth daily.    [provider]  Carboxymethylcellul-Glycerin (REFRESH RELIEVA PF OP) Place 1 drop into both eyes 2 (two) times a week.    [provider]  ezetimibe (ZETIA) 10 MG tablet Take 10 mg by mouth daily. 09/14/18   [provider]  Multiple Vitamin (MULTIVITAMIN WITH MINERALS) TABS tablet Take 1 tablet by mouth daily.    [provider]  Multiple Vitamins-Minerals (PRESERVISION AREDS 2 PO) Take 1 tablet by mouth daily.    [provider]  OVER THE COUNTER MEDICATION Take 1 capsule by mouth daily. Hydro Eyes    [provider]  oxybutynin (DITROPAN) 5 MG  tablet Take 5 mg by mouth daily.     [provider]  rosuvastatin (CRESTOR) 10 MG tablet TAKE 1 TABLET BY MOUTH EVERY DAY 07/05/21   Patwardhan, Anabel Bene, MD      Allergies    Pravastatin and Lipitor [atorvastatin]    Review of Systems   Review of Systems  Eyes:  Negative for visual disturbance.  Respiratory:  Negative for shortness of breath.   Cardiovascular:  Negative for chest pain.  Gastrointestinal:  Negative for vomiting.  Neurological:  Positive for dizziness, light-headedness and headaches. Negative for weakness and numbness.    Physical Exam Updated Vital Signs BP (!) 147/74 (BP Location: Right Arm)   Pulse 69   Temp 97.7 F (36.5 C)   Resp 16   Ht 6' (1.829 m)   Wt 79.4 kg   SpO2 95%   BMI 23.73 kg/m  Physical Exam Vitals and nursing note reviewed.  Constitutional:      Appearance: He is well-developed.  HENT:     Head: Normocephalic and atraumatic.     Right Ear: Tympanic membrane normal.     Left Ear: Tympanic membrane normal.  Eyes:     Extraocular Movements: Extraocular movements intact.     Pupils: Pupils are equal, round, and reactive to light.  Cardiovascular:     Rate and Rhythm: Normal rate and regular rhythm.     Heart sounds: Normal heart sounds.  Pulmonary:     Effort: Pulmonary effort is normal.  Breath sounds: Normal breath sounds.  Abdominal:     Palpations: Abdomen is soft.     Tenderness: There is no abdominal tenderness.  Musculoskeletal:     Cervical back: Normal range of motion. No rigidity.  Skin:    General: Skin is warm and dry.  Neurological:     Mental Status: He is alert.     Comments: CN 3-12 grossly intact. 5/5 strength in all 4 extremities. Grossly normal sensation. Normal finger to nose. Normal gait     ED Results / Procedures / Treatments   Labs (all labs ordered are listed, but only abnormal results are displayed) Labs Reviewed  COMPREHENSIVE METABOLIC PANEL - Abnormal; Notable for the following  components:      Result Value   Glucose, Bld 113 (*)    All other components within normal limits  I-STAT CHEM 8, ED - Abnormal; Notable for the following components:   Glucose, Bld 111 (*)    All other components within normal limits  CBG MONITORING, ED - Abnormal; Notable for the following components:   Glucose-Capillary 104 (*)    All other components within normal limits  PROTIME-INR  APTT  CBC  DIFFERENTIAL  ETHANOL  URINALYSIS, ROUTINE W REFLEX MICROSCOPIC    EKG EKG Interpretation  Date/Time:  Thursday May 01 2023 12:32:16 EDT Ventricular Rate:  77 PR Interval:  154 QRS Duration: 80 QT Interval:  386 QTC Calculation: 436 R Axis:   -10 Text Interpretation: Normal sinus rhythm Normal ECG no significant change since Dec 2023 Confirmed by Pricilla Loveless [phone]) on 05/01/2023 1:37:45 PM  Radiology CT HEAD WO CONTRAST  Result Date: 05/01/2023 CLINICAL DATA:  Provided history: Vertigo, peripheral dizziness, hearing changes. EXAM: CT HEAD WITHOUT CONTRAST TECHNIQUE: Contiguous axial images were obtained from the base of the skull through the vertex without intravenous contrast. RADIATION DOSE REDUCTION: This exam was performed according to the departmental dose-optimization program which includes automated exposure control, adjustment of the mA and/or kV according to patient size and/or use of iterative reconstruction technique. COMPARISON:  Brain MRI 03/29/2019.  Head CT 04/17/2011. FINDINGS: Brain: No age advanced or lobar predominant parenchymal atrophy. There is no acute intracranial hemorrhage. No demarcated cortical infarct. No extra-axial fluid collection. No evidence of an intracranial mass. No midline shift. Vascular: No hyperdense vessel.  Atherosclerotic calcifications. Skull: No fracture or aggressive osseous lesion. Sinuses/Orbits: No mass or acute finding within the imaged orbits. Mild mucosal thickening within bilateral frontal sinuses, bilateral ethmoid sinuses,  bilateral sphenoid sinuses, and right maxillary sinus at the imaged levels. IMPRESSION: 1.  No evidence of an acute intracranial abnormality. 2. Mild paranasal sinus mucosal thickening at the imaged levels. Electronically Signed   By: Jackey Loge D.O.   On: 05/01/2023 15:17    Procedures Procedures    Medications Ordered in ED Medications  sodium chloride flush (NS) 0.9 % injection 3 mL (3 mLs Intravenous Given 05/01/23 1339)  meclizine (ANTIVERT) tablet 25 mg (25 mg Oral Given 05/01/23 1358)  lactated ringers bolus 1,000 mL (1,000 mLs Intravenous Bolus 05/01/23 1359)    ED Course/ Medical Decision Making/ A&P                             Medical Decision Making Amount and/or Complexity of Data Reviewed Labs: ordered.    Details: No significant electrolyte disturbance. Radiology: ordered and independent interpretation performed.    Details: No head bleed. ECG/medicine tests: ordered and independent interpretation  performed.    Details: No ischemia   Patient appears to have likely vertigo.  Sounds like this is similar to what he experienced when he was on a roller coaster.  However the frequent on and off nature is a little concerning.  Discussed this is probably peripheral vertigo but after talking to family he would prefer to do MRI to rule out stroke.  I doubt TIA given the recurrent symptoms.  I think MRI is reasonable given he does have a history of hyperlipidemia and his age are both risk factors for stroke.  If MRI is unremarkable, he seems to be a lot better and I think is stable for outpatient management.  Care to Dr. Particia Nearing.        Final Clinical Impression(s) / ED Diagnoses Final diagnoses:  None    Rx / DC Orders ED Discharge Orders          Ordered    meclizine (ANTIVERT) 25 MG tablet  3 times daily PRN        05/01/23 1530              Pricilla Loveless, MD 05/01/23 1556

## 2023-05-01 NOTE — ED Triage Notes (Addendum)
Here by POV with brother for light headedness, onset this am at 1030 while at church. Describes episode as light headed, foggy brain, hearing was like an echo. Denies pain, HA, NV, syncope, spinning, visual changes, numbness, tingling, or weakness. Reports 2 episodes. Has not completely resolved. Sx are worsened or effected by moving head. Returned from recent trip to [place] on Sunday (drove) which included roller coaster type rides. No h/o vertigo.

## 2023-05-01 NOTE — ED Provider Notes (Signed)
Pt signed out by Dr. Criss Alvine pending MRI.  MRI reviewed.  I agree with the radiologist.  MRI:  No acute intracranial process. No evidence of acute or subacute  infarct.    Pt feels much better.  He is able to ambulate without any problems.  He is stable for d/c.  Return if worse.  F/u with pcp.     Jacalyn Lefevre, MD 05/01/23 [phone]
